# Patient Record
Sex: Female | Born: 1950
Health system: Southern US, Community
[De-identification: ages and names within clinical notes are randomized; demographics above are authoritative.]

## PROBLEM LIST (undated history)

## (undated) HISTORY — PX: EYE SURGERY: SHX253

## (undated) HISTORY — PX: TUBAL LIGATION: SHX77

## (undated) HISTORY — PX: APPENDECTOMY: SHX54

## (undated) HISTORY — PX: THYROID SURGERY: SHX805

---

## 2001-06-07 ENCOUNTER — Emergency Department (HOSPITAL_COMMUNITY): Admission: EM | Admit: 2001-06-07 | Discharge: 2001-06-07 | Payer: Self-pay | Admitting: Emergency Medicine

## 2003-06-28 ENCOUNTER — Ambulatory Visit (HOSPITAL_COMMUNITY): Admission: RE | Admit: 2003-06-28 | Discharge: 2003-06-28 | Payer: Self-pay | Admitting: Obstetrics and Gynecology

## 2012-05-18 ENCOUNTER — Other Ambulatory Visit: Payer: Self-pay | Admitting: Obstetrics and Gynecology

## 2012-05-18 ENCOUNTER — Other Ambulatory Visit (HOSPITAL_COMMUNITY)
Admission: RE | Admit: 2012-05-18 | Discharge: 2012-05-18 | Disposition: A | Discharge status: Home / Self Care | Payer: BC Managed Care – PPO | Source: Ambulatory Visit | Attending: Obstetrics and Gynecology | Admitting: Obstetrics and Gynecology

## 2012-05-18 DIAGNOSIS — Z01419 Encounter for gynecological examination (general) (routine) without abnormal findings: Secondary | ICD-10-CM | POA: Insufficient documentation

## 2012-05-18 DIAGNOSIS — Z1151 Encounter for screening for human papillomavirus (HPV): Secondary | ICD-10-CM | POA: Insufficient documentation

## 2012-08-12 ENCOUNTER — Other Ambulatory Visit: Payer: Self-pay | Admitting: Dermatology

## 2014-10-02 ENCOUNTER — Other Ambulatory Visit: Payer: Self-pay | Admitting: Dermatology

## 2016-10-07 DIAGNOSIS — L814 Other melanin hyperpigmentation: Secondary | ICD-10-CM | POA: Diagnosis not present

## 2016-10-07 DIAGNOSIS — D225 Melanocytic nevi of trunk: Secondary | ICD-10-CM | POA: Diagnosis not present

## 2016-10-07 DIAGNOSIS — C44612 Basal cell carcinoma of skin of right upper limb, including shoulder: Secondary | ICD-10-CM | POA: Diagnosis not present

## 2016-10-07 DIAGNOSIS — C44712 Basal cell carcinoma of skin of right lower limb, including hip: Secondary | ICD-10-CM | POA: Diagnosis not present

## 2016-10-07 DIAGNOSIS — D485 Neoplasm of uncertain behavior of skin: Secondary | ICD-10-CM | POA: Diagnosis not present

## 2016-10-07 DIAGNOSIS — D2261 Melanocytic nevi of right upper limb, including shoulder: Secondary | ICD-10-CM | POA: Diagnosis not present

## 2016-10-07 DIAGNOSIS — Z85828 Personal history of other malignant neoplasm of skin: Secondary | ICD-10-CM | POA: Diagnosis not present

## 2016-10-07 DIAGNOSIS — C44619 Basal cell carcinoma of skin of left upper limb, including shoulder: Secondary | ICD-10-CM | POA: Diagnosis not present

## 2016-10-07 DIAGNOSIS — L570 Actinic keratosis: Secondary | ICD-10-CM | POA: Diagnosis not present

## 2016-10-07 DIAGNOSIS — L821 Other seborrheic keratosis: Secondary | ICD-10-CM | POA: Diagnosis not present

## 2016-10-19 DIAGNOSIS — Z0000 Encounter for general adult medical examination without abnormal findings: Secondary | ICD-10-CM | POA: Diagnosis not present

## 2016-10-19 DIAGNOSIS — E785 Hyperlipidemia, unspecified: Secondary | ICD-10-CM | POA: Diagnosis not present

## 2016-10-19 DIAGNOSIS — E039 Hypothyroidism, unspecified: Secondary | ICD-10-CM | POA: Diagnosis not present

## 2016-10-19 DIAGNOSIS — E119 Type 2 diabetes mellitus without complications: Secondary | ICD-10-CM | POA: Diagnosis not present

## 2016-11-09 DIAGNOSIS — Z85828 Personal history of other malignant neoplasm of skin: Secondary | ICD-10-CM | POA: Diagnosis not present

## 2016-11-09 DIAGNOSIS — C44712 Basal cell carcinoma of skin of right lower limb, including hip: Secondary | ICD-10-CM | POA: Diagnosis not present

## 2016-11-09 DIAGNOSIS — C44612 Basal cell carcinoma of skin of right upper limb, including shoulder: Secondary | ICD-10-CM | POA: Diagnosis not present

## 2016-11-09 DIAGNOSIS — C44619 Basal cell carcinoma of skin of left upper limb, including shoulder: Secondary | ICD-10-CM | POA: Diagnosis not present

## 2016-11-09 DIAGNOSIS — L570 Actinic keratosis: Secondary | ICD-10-CM | POA: Diagnosis not present

## 2016-11-18 DIAGNOSIS — L0889 Other specified local infections of the skin and subcutaneous tissue: Secondary | ICD-10-CM | POA: Diagnosis not present

## 2016-12-21 DIAGNOSIS — E039 Hypothyroidism, unspecified: Secondary | ICD-10-CM | POA: Diagnosis not present

## 2016-12-21 DIAGNOSIS — E785 Hyperlipidemia, unspecified: Secondary | ICD-10-CM | POA: Diagnosis not present

## 2017-01-15 DIAGNOSIS — J0100 Acute maxillary sinusitis, unspecified: Secondary | ICD-10-CM | POA: Diagnosis not present

## 2017-01-25 ENCOUNTER — Other Ambulatory Visit (HOSPITAL_COMMUNITY)
Admission: RE | Admit: 2017-01-25 | Discharge: 2017-01-25 | Disposition: A | Discharge status: Home / Self Care | Payer: Medicare HMO | Source: Ambulatory Visit | Attending: Obstetrics and Gynecology | Admitting: Obstetrics and Gynecology

## 2017-01-25 ENCOUNTER — Other Ambulatory Visit: Payer: Self-pay | Admitting: Obstetrics and Gynecology

## 2017-01-25 DIAGNOSIS — N951 Menopausal and female climacteric states: Secondary | ICD-10-CM | POA: Diagnosis not present

## 2017-01-25 DIAGNOSIS — Z01419 Encounter for gynecological examination (general) (routine) without abnormal findings: Secondary | ICD-10-CM | POA: Diagnosis not present

## 2017-01-25 DIAGNOSIS — Z124 Encounter for screening for malignant neoplasm of cervix: Secondary | ICD-10-CM | POA: Diagnosis present

## 2017-01-27 LAB — CYTOLOGY - PAP
Diagnosis: NEGATIVE
HPV: NOT DETECTED

## 2017-03-15 DIAGNOSIS — Z1231 Encounter for screening mammogram for malignant neoplasm of breast: Secondary | ICD-10-CM | POA: Diagnosis not present

## 2017-04-12 DIAGNOSIS — Z780 Asymptomatic menopausal state: Secondary | ICD-10-CM | POA: Diagnosis not present

## 2017-04-12 DIAGNOSIS — D225 Melanocytic nevi of trunk: Secondary | ICD-10-CM | POA: Diagnosis not present

## 2017-04-12 DIAGNOSIS — L821 Other seborrheic keratosis: Secondary | ICD-10-CM | POA: Diagnosis not present

## 2017-04-12 DIAGNOSIS — L814 Other melanin hyperpigmentation: Secondary | ICD-10-CM | POA: Diagnosis not present

## 2017-04-12 DIAGNOSIS — D2262 Melanocytic nevi of left upper limb, including shoulder: Secondary | ICD-10-CM | POA: Diagnosis not present

## 2017-04-12 DIAGNOSIS — D1801 Hemangioma of skin and subcutaneous tissue: Secondary | ICD-10-CM | POA: Diagnosis not present

## 2017-04-12 DIAGNOSIS — D2239 Melanocytic nevi of other parts of face: Secondary | ICD-10-CM | POA: Diagnosis not present

## 2017-04-12 DIAGNOSIS — Z85828 Personal history of other malignant neoplasm of skin: Secondary | ICD-10-CM | POA: Diagnosis not present

## 2017-04-12 DIAGNOSIS — D2371 Other benign neoplasm of skin of right lower limb, including hip: Secondary | ICD-10-CM | POA: Diagnosis not present

## 2017-04-12 DIAGNOSIS — C44619 Basal cell carcinoma of skin of left upper limb, including shoulder: Secondary | ICD-10-CM | POA: Diagnosis not present

## 2017-04-12 DIAGNOSIS — L570 Actinic keratosis: Secondary | ICD-10-CM | POA: Diagnosis not present

## 2017-04-12 DIAGNOSIS — C44519 Basal cell carcinoma of skin of other part of trunk: Secondary | ICD-10-CM | POA: Diagnosis not present

## 2017-04-12 DIAGNOSIS — M810 Age-related osteoporosis without current pathological fracture: Secondary | ICD-10-CM | POA: Diagnosis not present

## 2017-04-15 DIAGNOSIS — C44519 Basal cell carcinoma of skin of other part of trunk: Secondary | ICD-10-CM | POA: Diagnosis not present

## 2017-04-15 DIAGNOSIS — Z85828 Personal history of other malignant neoplasm of skin: Secondary | ICD-10-CM | POA: Diagnosis not present

## 2017-05-10 DIAGNOSIS — M810 Age-related osteoporosis without current pathological fracture: Secondary | ICD-10-CM | POA: Diagnosis not present

## 2017-05-10 DIAGNOSIS — E890 Postprocedural hypothyroidism: Secondary | ICD-10-CM | POA: Diagnosis not present

## 2017-06-16 DIAGNOSIS — R69 Illness, unspecified: Secondary | ICD-10-CM | POA: Diagnosis not present

## 2017-06-21 DIAGNOSIS — H52203 Unspecified astigmatism, bilateral: Secondary | ICD-10-CM | POA: Diagnosis not present

## 2017-06-21 DIAGNOSIS — H5203 Hypermetropia, bilateral: Secondary | ICD-10-CM | POA: Diagnosis not present

## 2017-06-21 DIAGNOSIS — H524 Presbyopia: Secondary | ICD-10-CM | POA: Diagnosis not present

## 2017-07-14 DIAGNOSIS — E039 Hypothyroidism, unspecified: Secondary | ICD-10-CM | POA: Diagnosis not present

## 2017-08-18 DIAGNOSIS — D1801 Hemangioma of skin and subcutaneous tissue: Secondary | ICD-10-CM | POA: Diagnosis not present

## 2017-08-18 DIAGNOSIS — L814 Other melanin hyperpigmentation: Secondary | ICD-10-CM | POA: Diagnosis not present

## 2017-08-18 DIAGNOSIS — Z85828 Personal history of other malignant neoplasm of skin: Secondary | ICD-10-CM | POA: Diagnosis not present

## 2017-08-18 DIAGNOSIS — L438 Other lichen planus: Secondary | ICD-10-CM | POA: Diagnosis not present

## 2017-09-30 DIAGNOSIS — E7800 Pure hypercholesterolemia, unspecified: Secondary | ICD-10-CM | POA: Diagnosis not present

## 2017-09-30 DIAGNOSIS — E039 Hypothyroidism, unspecified: Secondary | ICD-10-CM | POA: Diagnosis not present

## 2017-09-30 DIAGNOSIS — E119 Type 2 diabetes mellitus without complications: Secondary | ICD-10-CM | POA: Diagnosis not present

## 2017-10-01 DIAGNOSIS — E039 Hypothyroidism, unspecified: Secondary | ICD-10-CM | POA: Diagnosis not present

## 2017-10-20 DIAGNOSIS — C73 Malignant neoplasm of thyroid gland: Secondary | ICD-10-CM | POA: Diagnosis not present

## 2017-10-20 DIAGNOSIS — E890 Postprocedural hypothyroidism: Secondary | ICD-10-CM | POA: Diagnosis not present

## 2017-11-17 DIAGNOSIS — E890 Postprocedural hypothyroidism: Secondary | ICD-10-CM | POA: Diagnosis not present

## 2017-11-19 DIAGNOSIS — D2272 Melanocytic nevi of left lower limb, including hip: Secondary | ICD-10-CM | POA: Diagnosis not present

## 2017-11-19 DIAGNOSIS — Z85828 Personal history of other malignant neoplasm of skin: Secondary | ICD-10-CM | POA: Diagnosis not present

## 2017-11-19 DIAGNOSIS — D2271 Melanocytic nevi of right lower limb, including hip: Secondary | ICD-10-CM | POA: Diagnosis not present

## 2017-11-19 DIAGNOSIS — D2261 Melanocytic nevi of right upper limb, including shoulder: Secondary | ICD-10-CM | POA: Diagnosis not present

## 2017-11-19 DIAGNOSIS — C44519 Basal cell carcinoma of skin of other part of trunk: Secondary | ICD-10-CM | POA: Diagnosis not present

## 2017-11-19 DIAGNOSIS — D2262 Melanocytic nevi of left upper limb, including shoulder: Secondary | ICD-10-CM | POA: Diagnosis not present

## 2017-11-19 DIAGNOSIS — L821 Other seborrheic keratosis: Secondary | ICD-10-CM | POA: Diagnosis not present

## 2017-11-19 DIAGNOSIS — D225 Melanocytic nevi of trunk: Secondary | ICD-10-CM | POA: Diagnosis not present

## 2017-11-19 DIAGNOSIS — L814 Other melanin hyperpigmentation: Secondary | ICD-10-CM | POA: Diagnosis not present

## 2017-11-19 DIAGNOSIS — D1801 Hemangioma of skin and subcutaneous tissue: Secondary | ICD-10-CM | POA: Diagnosis not present

## 2017-12-10 DIAGNOSIS — K644 Residual hemorrhoidal skin tags: Secondary | ICD-10-CM | POA: Diagnosis not present

## 2017-12-22 DIAGNOSIS — R69 Illness, unspecified: Secondary | ICD-10-CM | POA: Diagnosis not present

## 2018-02-21 DIAGNOSIS — Z01411 Encounter for gynecological examination (general) (routine) with abnormal findings: Secondary | ICD-10-CM | POA: Diagnosis not present

## 2018-02-21 DIAGNOSIS — M810 Age-related osteoporosis without current pathological fracture: Secondary | ICD-10-CM | POA: Diagnosis not present

## 2018-02-21 DIAGNOSIS — E890 Postprocedural hypothyroidism: Secondary | ICD-10-CM | POA: Diagnosis not present

## 2018-03-23 DIAGNOSIS — Z1231 Encounter for screening mammogram for malignant neoplasm of breast: Secondary | ICD-10-CM | POA: Diagnosis not present

## 2018-04-01 DIAGNOSIS — L820 Inflamed seborrheic keratosis: Secondary | ICD-10-CM | POA: Diagnosis not present

## 2018-04-01 DIAGNOSIS — Z85828 Personal history of other malignant neoplasm of skin: Secondary | ICD-10-CM | POA: Diagnosis not present

## 2018-05-09 DIAGNOSIS — K921 Melena: Secondary | ICD-10-CM | POA: Diagnosis not present

## 2018-05-09 DIAGNOSIS — K5900 Constipation, unspecified: Secondary | ICD-10-CM | POA: Diagnosis not present

## 2018-05-18 DIAGNOSIS — K642 Third degree hemorrhoids: Secondary | ICD-10-CM | POA: Diagnosis not present

## 2018-05-18 DIAGNOSIS — K921 Melena: Secondary | ICD-10-CM | POA: Diagnosis not present

## 2018-05-18 DIAGNOSIS — K635 Polyp of colon: Secondary | ICD-10-CM | POA: Diagnosis not present

## 2018-05-18 DIAGNOSIS — D126 Benign neoplasm of colon, unspecified: Secondary | ICD-10-CM | POA: Diagnosis not present

## 2018-05-20 DIAGNOSIS — L570 Actinic keratosis: Secondary | ICD-10-CM | POA: Diagnosis not present

## 2018-05-20 DIAGNOSIS — L821 Other seborrheic keratosis: Secondary | ICD-10-CM | POA: Diagnosis not present

## 2018-05-20 DIAGNOSIS — L820 Inflamed seborrheic keratosis: Secondary | ICD-10-CM | POA: Diagnosis not present

## 2018-05-20 DIAGNOSIS — D225 Melanocytic nevi of trunk: Secondary | ICD-10-CM | POA: Diagnosis not present

## 2018-05-20 DIAGNOSIS — D1801 Hemangioma of skin and subcutaneous tissue: Secondary | ICD-10-CM | POA: Diagnosis not present

## 2018-05-20 DIAGNOSIS — D126 Benign neoplasm of colon, unspecified: Secondary | ICD-10-CM | POA: Diagnosis not present

## 2018-05-20 DIAGNOSIS — D2239 Melanocytic nevi of other parts of face: Secondary | ICD-10-CM | POA: Diagnosis not present

## 2018-05-20 DIAGNOSIS — C44519 Basal cell carcinoma of skin of other part of trunk: Secondary | ICD-10-CM | POA: Diagnosis not present

## 2018-05-20 DIAGNOSIS — K635 Polyp of colon: Secondary | ICD-10-CM | POA: Diagnosis not present

## 2018-05-20 DIAGNOSIS — Z85828 Personal history of other malignant neoplasm of skin: Secondary | ICD-10-CM | POA: Diagnosis not present

## 2018-05-20 DIAGNOSIS — D2262 Melanocytic nevi of left upper limb, including shoulder: Secondary | ICD-10-CM | POA: Diagnosis not present

## 2018-05-20 DIAGNOSIS — D2261 Melanocytic nevi of right upper limb, including shoulder: Secondary | ICD-10-CM | POA: Diagnosis not present

## 2018-06-27 DIAGNOSIS — H5203 Hypermetropia, bilateral: Secondary | ICD-10-CM | POA: Diagnosis not present

## 2018-06-27 DIAGNOSIS — H524 Presbyopia: Secondary | ICD-10-CM | POA: Diagnosis not present

## 2018-06-27 DIAGNOSIS — E119 Type 2 diabetes mellitus without complications: Secondary | ICD-10-CM | POA: Diagnosis not present

## 2018-06-27 DIAGNOSIS — H52203 Unspecified astigmatism, bilateral: Secondary | ICD-10-CM | POA: Diagnosis not present

## 2018-06-27 DIAGNOSIS — H2513 Age-related nuclear cataract, bilateral: Secondary | ICD-10-CM | POA: Diagnosis not present

## 2018-06-30 DIAGNOSIS — E559 Vitamin D deficiency, unspecified: Secondary | ICD-10-CM | POA: Diagnosis not present

## 2018-06-30 DIAGNOSIS — E039 Hypothyroidism, unspecified: Secondary | ICD-10-CM | POA: Diagnosis not present

## 2018-06-30 DIAGNOSIS — E7800 Pure hypercholesterolemia, unspecified: Secondary | ICD-10-CM | POA: Diagnosis not present

## 2018-06-30 DIAGNOSIS — E119 Type 2 diabetes mellitus without complications: Secondary | ICD-10-CM | POA: Diagnosis not present

## 2018-06-30 DIAGNOSIS — R69 Illness, unspecified: Secondary | ICD-10-CM | POA: Diagnosis not present

## 2018-10-05 DIAGNOSIS — E785 Hyperlipidemia, unspecified: Secondary | ICD-10-CM | POA: Diagnosis not present

## 2018-10-14 DIAGNOSIS — H16292 Other keratoconjunctivitis, left eye: Secondary | ICD-10-CM | POA: Diagnosis not present

## 2018-11-30 DIAGNOSIS — D1801 Hemangioma of skin and subcutaneous tissue: Secondary | ICD-10-CM | POA: Diagnosis not present

## 2018-11-30 DIAGNOSIS — D225 Melanocytic nevi of trunk: Secondary | ICD-10-CM | POA: Diagnosis not present

## 2018-11-30 DIAGNOSIS — L570 Actinic keratosis: Secondary | ICD-10-CM | POA: Diagnosis not present

## 2018-11-30 DIAGNOSIS — D2262 Melanocytic nevi of left upper limb, including shoulder: Secondary | ICD-10-CM | POA: Diagnosis not present

## 2018-11-30 DIAGNOSIS — L821 Other seborrheic keratosis: Secondary | ICD-10-CM | POA: Diagnosis not present

## 2018-11-30 DIAGNOSIS — Z85828 Personal history of other malignant neoplasm of skin: Secondary | ICD-10-CM | POA: Diagnosis not present

## 2018-11-30 DIAGNOSIS — L814 Other melanin hyperpigmentation: Secondary | ICD-10-CM | POA: Diagnosis not present

## 2018-11-30 DIAGNOSIS — D2261 Melanocytic nevi of right upper limb, including shoulder: Secondary | ICD-10-CM | POA: Diagnosis not present

## 2018-11-30 DIAGNOSIS — C44519 Basal cell carcinoma of skin of other part of trunk: Secondary | ICD-10-CM | POA: Diagnosis not present

## 2019-03-08 DIAGNOSIS — E119 Type 2 diabetes mellitus without complications: Secondary | ICD-10-CM | POA: Diagnosis not present

## 2019-03-08 DIAGNOSIS — E559 Vitamin D deficiency, unspecified: Secondary | ICD-10-CM | POA: Diagnosis not present

## 2019-03-08 DIAGNOSIS — E7800 Pure hypercholesterolemia, unspecified: Secondary | ICD-10-CM | POA: Diagnosis not present

## 2019-03-08 DIAGNOSIS — E890 Postprocedural hypothyroidism: Secondary | ICD-10-CM | POA: Diagnosis not present

## 2019-03-21 DIAGNOSIS — R69 Illness, unspecified: Secondary | ICD-10-CM | POA: Diagnosis not present

## 2019-03-22 DIAGNOSIS — E119 Type 2 diabetes mellitus without complications: Secondary | ICD-10-CM | POA: Diagnosis not present

## 2019-03-22 DIAGNOSIS — E890 Postprocedural hypothyroidism: Secondary | ICD-10-CM | POA: Diagnosis not present

## 2019-03-29 DIAGNOSIS — Z1231 Encounter for screening mammogram for malignant neoplasm of breast: Secondary | ICD-10-CM | POA: Diagnosis not present

## 2019-03-31 DIAGNOSIS — M810 Age-related osteoporosis without current pathological fracture: Secondary | ICD-10-CM | POA: Diagnosis not present

## 2019-03-31 DIAGNOSIS — Z85850 Personal history of malignant neoplasm of thyroid: Secondary | ICD-10-CM | POA: Diagnosis not present

## 2019-03-31 DIAGNOSIS — Z8262 Family history of osteoporosis: Secondary | ICD-10-CM | POA: Diagnosis not present

## 2019-03-31 DIAGNOSIS — Z780 Asymptomatic menopausal state: Secondary | ICD-10-CM | POA: Diagnosis not present

## 2019-04-13 DIAGNOSIS — Z01419 Encounter for gynecological examination (general) (routine) without abnormal findings: Secondary | ICD-10-CM | POA: Diagnosis not present

## 2019-05-16 DIAGNOSIS — H11002 Unspecified pterygium of left eye: Secondary | ICD-10-CM | POA: Diagnosis not present

## 2019-05-16 DIAGNOSIS — R030 Elevated blood-pressure reading, without diagnosis of hypertension: Secondary | ICD-10-CM | POA: Diagnosis not present

## 2019-05-16 DIAGNOSIS — H1132 Conjunctival hemorrhage, left eye: Secondary | ICD-10-CM | POA: Diagnosis not present

## 2019-06-02 DIAGNOSIS — Z85828 Personal history of other malignant neoplasm of skin: Secondary | ICD-10-CM | POA: Diagnosis not present

## 2019-06-02 DIAGNOSIS — D225 Melanocytic nevi of trunk: Secondary | ICD-10-CM | POA: Diagnosis not present

## 2019-06-02 DIAGNOSIS — D2272 Melanocytic nevi of left lower limb, including hip: Secondary | ICD-10-CM | POA: Diagnosis not present

## 2019-06-02 DIAGNOSIS — L821 Other seborrheic keratosis: Secondary | ICD-10-CM | POA: Diagnosis not present

## 2019-06-20 DIAGNOSIS — E890 Postprocedural hypothyroidism: Secondary | ICD-10-CM | POA: Diagnosis not present

## 2019-06-20 DIAGNOSIS — R69 Illness, unspecified: Secondary | ICD-10-CM | POA: Diagnosis not present

## 2019-06-29 DIAGNOSIS — H52203 Unspecified astigmatism, bilateral: Secondary | ICD-10-CM | POA: Diagnosis not present

## 2019-06-29 DIAGNOSIS — H5203 Hypermetropia, bilateral: Secondary | ICD-10-CM | POA: Diagnosis not present

## 2019-06-29 DIAGNOSIS — H524 Presbyopia: Secondary | ICD-10-CM | POA: Diagnosis not present

## 2019-09-12 DIAGNOSIS — R69 Illness, unspecified: Secondary | ICD-10-CM | POA: Diagnosis not present

## 2019-09-12 DIAGNOSIS — E890 Postprocedural hypothyroidism: Secondary | ICD-10-CM | POA: Diagnosis not present

## 2019-09-12 DIAGNOSIS — E119 Type 2 diabetes mellitus without complications: Secondary | ICD-10-CM | POA: Diagnosis not present

## 2019-09-12 DIAGNOSIS — E7800 Pure hypercholesterolemia, unspecified: Secondary | ICD-10-CM | POA: Diagnosis not present

## 2019-11-20 DIAGNOSIS — Z7189 Other specified counseling: Secondary | ICD-10-CM | POA: Diagnosis not present

## 2019-11-20 DIAGNOSIS — R1310 Dysphagia, unspecified: Secondary | ICD-10-CM | POA: Diagnosis not present

## 2019-11-20 DIAGNOSIS — R519 Headache, unspecified: Secondary | ICD-10-CM | POA: Diagnosis not present

## 2019-11-20 DIAGNOSIS — R197 Diarrhea, unspecified: Secondary | ICD-10-CM | POA: Diagnosis not present

## 2019-12-05 DIAGNOSIS — Z85828 Personal history of other malignant neoplasm of skin: Secondary | ICD-10-CM | POA: Diagnosis not present

## 2019-12-05 DIAGNOSIS — D2262 Melanocytic nevi of left upper limb, including shoulder: Secondary | ICD-10-CM | POA: Diagnosis not present

## 2019-12-05 DIAGNOSIS — L438 Other lichen planus: Secondary | ICD-10-CM | POA: Diagnosis not present

## 2019-12-05 DIAGNOSIS — L821 Other seborrheic keratosis: Secondary | ICD-10-CM | POA: Diagnosis not present

## 2019-12-05 DIAGNOSIS — D2261 Melanocytic nevi of right upper limb, including shoulder: Secondary | ICD-10-CM | POA: Diagnosis not present

## 2019-12-05 DIAGNOSIS — D2272 Melanocytic nevi of left lower limb, including hip: Secondary | ICD-10-CM | POA: Diagnosis not present

## 2019-12-05 DIAGNOSIS — L814 Other melanin hyperpigmentation: Secondary | ICD-10-CM | POA: Diagnosis not present

## 2019-12-05 DIAGNOSIS — D225 Melanocytic nevi of trunk: Secondary | ICD-10-CM | POA: Diagnosis not present

## 2019-12-05 DIAGNOSIS — D2239 Melanocytic nevi of other parts of face: Secondary | ICD-10-CM | POA: Diagnosis not present

## 2019-12-05 DIAGNOSIS — D2271 Melanocytic nevi of right lower limb, including hip: Secondary | ICD-10-CM | POA: Diagnosis not present

## 2019-12-26 DIAGNOSIS — R69 Illness, unspecified: Secondary | ICD-10-CM | POA: Diagnosis not present

## 2019-12-27 DIAGNOSIS — Z0100 Encounter for examination of eyes and vision without abnormal findings: Secondary | ICD-10-CM | POA: Diagnosis not present

## 2019-12-27 DIAGNOSIS — H524 Presbyopia: Secondary | ICD-10-CM | POA: Diagnosis not present

## 2020-01-05 DIAGNOSIS — R0989 Other specified symptoms and signs involving the circulatory and respiratory systems: Secondary | ICD-10-CM | POA: Diagnosis not present

## 2020-01-05 DIAGNOSIS — E890 Postprocedural hypothyroidism: Secondary | ICD-10-CM | POA: Diagnosis not present

## 2020-01-08 ENCOUNTER — Ambulatory Visit
Admission: RE | Admit: 2020-01-08 | Discharge: 2020-01-08 | Disposition: A | Discharge status: Home / Self Care | Payer: Medicare HMO | Source: Ambulatory Visit | Attending: Family Medicine | Admitting: Family Medicine

## 2020-01-08 ENCOUNTER — Other Ambulatory Visit: Payer: Self-pay | Admitting: Family Medicine

## 2020-01-08 DIAGNOSIS — R0989 Other specified symptoms and signs involving the circulatory and respiratory systems: Secondary | ICD-10-CM

## 2020-01-08 DIAGNOSIS — J90 Pleural effusion, not elsewhere classified: Secondary | ICD-10-CM | POA: Diagnosis not present

## 2020-01-08 DIAGNOSIS — J984 Other disorders of lung: Secondary | ICD-10-CM | POA: Diagnosis not present

## 2020-03-11 DIAGNOSIS — Z20822 Contact with and (suspected) exposure to covid-19: Secondary | ICD-10-CM | POA: Diagnosis not present

## 2020-04-04 DIAGNOSIS — Z0000 Encounter for general adult medical examination without abnormal findings: Secondary | ICD-10-CM | POA: Diagnosis not present

## 2020-04-04 DIAGNOSIS — E7800 Pure hypercholesterolemia, unspecified: Secondary | ICD-10-CM | POA: Diagnosis not present

## 2020-04-04 DIAGNOSIS — E890 Postprocedural hypothyroidism: Secondary | ICD-10-CM | POA: Diagnosis not present

## 2020-04-04 DIAGNOSIS — E119 Type 2 diabetes mellitus without complications: Secondary | ICD-10-CM | POA: Diagnosis not present

## 2020-04-24 DIAGNOSIS — Z1231 Encounter for screening mammogram for malignant neoplasm of breast: Secondary | ICD-10-CM | POA: Diagnosis not present

## 2020-04-29 DIAGNOSIS — R928 Other abnormal and inconclusive findings on diagnostic imaging of breast: Secondary | ICD-10-CM | POA: Diagnosis not present

## 2020-04-29 DIAGNOSIS — R922 Inconclusive mammogram: Secondary | ICD-10-CM | POA: Diagnosis not present

## 2020-06-10 DIAGNOSIS — R69 Illness, unspecified: Secondary | ICD-10-CM | POA: Diagnosis not present

## 2020-06-17 DIAGNOSIS — D485 Neoplasm of uncertain behavior of skin: Secondary | ICD-10-CM | POA: Diagnosis not present

## 2020-06-17 DIAGNOSIS — D2261 Melanocytic nevi of right upper limb, including shoulder: Secondary | ICD-10-CM | POA: Diagnosis not present

## 2020-06-17 DIAGNOSIS — D225 Melanocytic nevi of trunk: Secondary | ICD-10-CM | POA: Diagnosis not present

## 2020-06-17 DIAGNOSIS — L570 Actinic keratosis: Secondary | ICD-10-CM | POA: Diagnosis not present

## 2020-06-17 DIAGNOSIS — D045 Carcinoma in situ of skin of trunk: Secondary | ICD-10-CM | POA: Diagnosis not present

## 2020-06-17 DIAGNOSIS — L821 Other seborrheic keratosis: Secondary | ICD-10-CM | POA: Diagnosis not present

## 2020-06-17 DIAGNOSIS — Z85828 Personal history of other malignant neoplasm of skin: Secondary | ICD-10-CM | POA: Diagnosis not present

## 2020-06-17 DIAGNOSIS — D2262 Melanocytic nevi of left upper limb, including shoulder: Secondary | ICD-10-CM | POA: Diagnosis not present

## 2020-07-01 DIAGNOSIS — H524 Presbyopia: Secondary | ICD-10-CM | POA: Diagnosis not present

## 2020-07-01 DIAGNOSIS — H2513 Age-related nuclear cataract, bilateral: Secondary | ICD-10-CM | POA: Diagnosis not present

## 2020-07-01 DIAGNOSIS — E1136 Type 2 diabetes mellitus with diabetic cataract: Secondary | ICD-10-CM | POA: Diagnosis not present

## 2020-07-01 DIAGNOSIS — H52203 Unspecified astigmatism, bilateral: Secondary | ICD-10-CM | POA: Diagnosis not present

## 2020-07-01 DIAGNOSIS — H5203 Hypermetropia, bilateral: Secondary | ICD-10-CM | POA: Diagnosis not present

## 2020-07-02 DIAGNOSIS — L988 Other specified disorders of the skin and subcutaneous tissue: Secondary | ICD-10-CM | POA: Diagnosis not present

## 2020-07-02 DIAGNOSIS — D485 Neoplasm of uncertain behavior of skin: Secondary | ICD-10-CM | POA: Diagnosis not present

## 2020-07-02 DIAGNOSIS — R69 Illness, unspecified: Secondary | ICD-10-CM | POA: Diagnosis not present

## 2020-07-02 DIAGNOSIS — Z85828 Personal history of other malignant neoplasm of skin: Secondary | ICD-10-CM | POA: Diagnosis not present

## 2020-07-02 DIAGNOSIS — D045 Carcinoma in situ of skin of trunk: Secondary | ICD-10-CM | POA: Diagnosis not present

## 2020-07-11 DIAGNOSIS — Z4802 Encounter for removal of sutures: Secondary | ICD-10-CM | POA: Diagnosis not present

## 2020-08-19 DIAGNOSIS — L988 Other specified disorders of the skin and subcutaneous tissue: Secondary | ICD-10-CM | POA: Diagnosis not present

## 2020-08-19 DIAGNOSIS — C73 Malignant neoplasm of thyroid gland: Secondary | ICD-10-CM | POA: Diagnosis not present

## 2020-08-19 DIAGNOSIS — E890 Postprocedural hypothyroidism: Secondary | ICD-10-CM | POA: Diagnosis not present

## 2020-08-19 DIAGNOSIS — C4359 Malignant melanoma of other part of trunk: Secondary | ICD-10-CM | POA: Diagnosis not present

## 2020-08-19 DIAGNOSIS — D485 Neoplasm of uncertain behavior of skin: Secondary | ICD-10-CM | POA: Diagnosis not present

## 2020-08-19 DIAGNOSIS — L821 Other seborrheic keratosis: Secondary | ICD-10-CM | POA: Diagnosis not present

## 2020-08-20 ENCOUNTER — Ambulatory Visit
Admission: RE | Admit: 2020-08-20 | Discharge: 2020-08-20 | Disposition: A | Discharge status: Home / Self Care | Payer: Managed Care, Other (non HMO) | Source: Ambulatory Visit | Attending: Family Medicine | Admitting: Family Medicine

## 2020-08-20 ENCOUNTER — Other Ambulatory Visit: Payer: Self-pay | Admitting: Family Medicine

## 2020-08-20 ENCOUNTER — Other Ambulatory Visit: Payer: Self-pay

## 2020-08-20 DIAGNOSIS — R52 Pain, unspecified: Secondary | ICD-10-CM

## 2020-08-20 DIAGNOSIS — M79672 Pain in left foot: Secondary | ICD-10-CM | POA: Diagnosis not present

## 2020-09-11 DIAGNOSIS — L821 Other seborrheic keratosis: Secondary | ICD-10-CM | POA: Diagnosis not present

## 2020-09-11 DIAGNOSIS — Z85820 Personal history of malignant melanoma of skin: Secondary | ICD-10-CM | POA: Diagnosis not present

## 2020-09-11 DIAGNOSIS — D485 Neoplasm of uncertain behavior of skin: Secondary | ICD-10-CM | POA: Diagnosis not present

## 2020-09-11 DIAGNOSIS — C4359 Malignant melanoma of other part of trunk: Secondary | ICD-10-CM | POA: Diagnosis not present

## 2020-09-25 DIAGNOSIS — M79672 Pain in left foot: Secondary | ICD-10-CM | POA: Diagnosis not present

## 2020-09-27 DIAGNOSIS — E7800 Pure hypercholesterolemia, unspecified: Secondary | ICD-10-CM | POA: Diagnosis not present

## 2020-09-27 DIAGNOSIS — E890 Postprocedural hypothyroidism: Secondary | ICD-10-CM | POA: Diagnosis not present

## 2020-09-27 DIAGNOSIS — E119 Type 2 diabetes mellitus without complications: Secondary | ICD-10-CM | POA: Diagnosis not present

## 2020-10-11 DIAGNOSIS — K2100 Gastro-esophageal reflux disease with esophagitis, without bleeding: Secondary | ICD-10-CM | POA: Diagnosis not present

## 2020-11-19 DIAGNOSIS — E890 Postprocedural hypothyroidism: Secondary | ICD-10-CM | POA: Diagnosis not present

## 2020-12-17 DIAGNOSIS — D225 Melanocytic nevi of trunk: Secondary | ICD-10-CM | POA: Diagnosis not present

## 2020-12-17 DIAGNOSIS — L570 Actinic keratosis: Secondary | ICD-10-CM | POA: Diagnosis not present

## 2020-12-17 DIAGNOSIS — L814 Other melanin hyperpigmentation: Secondary | ICD-10-CM | POA: Diagnosis not present

## 2020-12-17 DIAGNOSIS — D0359 Melanoma in situ of other part of trunk: Secondary | ICD-10-CM | POA: Diagnosis not present

## 2020-12-17 DIAGNOSIS — D2272 Melanocytic nevi of left lower limb, including hip: Secondary | ICD-10-CM | POA: Diagnosis not present

## 2020-12-17 DIAGNOSIS — D1801 Hemangioma of skin and subcutaneous tissue: Secondary | ICD-10-CM | POA: Diagnosis not present

## 2020-12-17 DIAGNOSIS — D045 Carcinoma in situ of skin of trunk: Secondary | ICD-10-CM | POA: Diagnosis not present

## 2020-12-17 DIAGNOSIS — L821 Other seborrheic keratosis: Secondary | ICD-10-CM | POA: Diagnosis not present

## 2020-12-17 DIAGNOSIS — Z85828 Personal history of other malignant neoplasm of skin: Secondary | ICD-10-CM | POA: Diagnosis not present

## 2020-12-17 DIAGNOSIS — Z85820 Personal history of malignant melanoma of skin: Secondary | ICD-10-CM | POA: Diagnosis not present

## 2020-12-31 DIAGNOSIS — D0359 Melanoma in situ of other part of trunk: Secondary | ICD-10-CM | POA: Diagnosis not present

## 2020-12-31 DIAGNOSIS — L988 Other specified disorders of the skin and subcutaneous tissue: Secondary | ICD-10-CM | POA: Diagnosis not present

## 2020-12-31 DIAGNOSIS — Z85820 Personal history of malignant melanoma of skin: Secondary | ICD-10-CM | POA: Diagnosis not present

## 2021-02-20 DIAGNOSIS — L570 Actinic keratosis: Secondary | ICD-10-CM | POA: Diagnosis not present

## 2021-02-20 DIAGNOSIS — L821 Other seborrheic keratosis: Secondary | ICD-10-CM | POA: Diagnosis not present

## 2021-02-20 DIAGNOSIS — Z85820 Personal history of malignant melanoma of skin: Secondary | ICD-10-CM | POA: Diagnosis not present

## 2021-03-07 DIAGNOSIS — E119 Type 2 diabetes mellitus without complications: Secondary | ICD-10-CM | POA: Diagnosis not present

## 2021-03-07 DIAGNOSIS — R252 Cramp and spasm: Secondary | ICD-10-CM | POA: Diagnosis not present

## 2021-03-20 DIAGNOSIS — Z85820 Personal history of malignant melanoma of skin: Secondary | ICD-10-CM | POA: Diagnosis not present

## 2021-03-20 DIAGNOSIS — L814 Other melanin hyperpigmentation: Secondary | ICD-10-CM | POA: Diagnosis not present

## 2021-03-20 DIAGNOSIS — D2239 Melanocytic nevi of other parts of face: Secondary | ICD-10-CM | POA: Diagnosis not present

## 2021-03-20 DIAGNOSIS — Z85828 Personal history of other malignant neoplasm of skin: Secondary | ICD-10-CM | POA: Diagnosis not present

## 2021-05-01 DIAGNOSIS — Z1231 Encounter for screening mammogram for malignant neoplasm of breast: Secondary | ICD-10-CM | POA: Diagnosis not present

## 2021-05-12 ENCOUNTER — Ambulatory Visit (INDEPENDENT_AMBULATORY_CARE_PROVIDER_SITE_OTHER): Payer: Medicare HMO

## 2021-05-12 ENCOUNTER — Ambulatory Visit (INDEPENDENT_AMBULATORY_CARE_PROVIDER_SITE_OTHER): Payer: Medicare HMO | Admitting: Podiatry

## 2021-05-12 ENCOUNTER — Encounter: Payer: Self-pay | Admitting: Podiatry

## 2021-05-12 ENCOUNTER — Other Ambulatory Visit: Payer: Self-pay

## 2021-05-12 DIAGNOSIS — M7752 Other enthesopathy of left foot: Secondary | ICD-10-CM | POA: Diagnosis not present

## 2021-05-12 DIAGNOSIS — M779 Enthesopathy, unspecified: Secondary | ICD-10-CM

## 2021-05-12 MED ORDER — TRIAMCINOLONE ACETONIDE 10 MG/ML IJ SUSP
10.0000 mg | Freq: Once | INTRAMUSCULAR | Status: AC
  Administered 2021-05-12: 10 mg

## 2021-05-14 NOTE — Progress Notes (Signed)
Subjective:   Patient ID: [name] [name], female   DOB: 70 y.o.   MRN: [id number]   HPI Patient presents stating she has a lot of pain on the dorsum of her left foot and this is been present for around 10 months.  She saw another physician who did not do anything to be of help to her and it stiff and bruising and she has had to stop activities.  She does not smoke and does get occasional numbness in her toes   Review of Systems  All other systems reviewed and are negative.      Objective:  Physical Exam Vitals and nursing note reviewed.  Constitutional:      Appearance: She is well-developed.  Pulmonary:     Effort: Pulmonary effort is normal.  Musculoskeletal:        General: Normal range of motion.  Skin:    General: Skin is warm.  Neurological:     Mental Status: She is alert.    Neurovascular status intact muscle strength found to be adequate range of motion adequate.  Patient is found to have inflammation fluid and pain of the third metatarsal phalangeal joint left foot with slight separation between the second and third toes and currently no discomfort in the interspace with mild discomfort of the second MPJ also noted.  Patient is found to have good digital perfusion well oriented x3     Assessment:  Probability for inflammatory capsulitis of the MPJ with possibility for flexor plate disruption or stretch     Plan:  H&P all conditions reviewed.  Today I anesthetized 60 mg like Marcaine mixture sterile prep done and using sterile instrumentation I aspirated the third MPJ getting out a small amount of clear fluid and injected quarter cc dexamethasone Kenalog and applied thick plantar pad to disperse weight off the joint along with rigid bottom shoes.  Patient will be seen back to recheck again in the next few weeks and is encouraged to call questions concerns which may arise  X-rays indicate that there is no signs that there is a fracture or arthritis associated with this with  mild separation of the second and third toes with medial deviation of the second digit

## 2021-05-21 DIAGNOSIS — Z0000 Encounter for general adult medical examination without abnormal findings: Secondary | ICD-10-CM | POA: Diagnosis not present

## 2021-05-21 DIAGNOSIS — E7800 Pure hypercholesterolemia, unspecified: Secondary | ICD-10-CM | POA: Diagnosis not present

## 2021-05-21 DIAGNOSIS — E119 Type 2 diabetes mellitus without complications: Secondary | ICD-10-CM | POA: Diagnosis not present

## 2021-05-21 DIAGNOSIS — E890 Postprocedural hypothyroidism: Secondary | ICD-10-CM | POA: Diagnosis not present

## 2021-06-02 ENCOUNTER — Ambulatory Visit (INDEPENDENT_AMBULATORY_CARE_PROVIDER_SITE_OTHER): Payer: Medicare HMO

## 2021-06-02 ENCOUNTER — Other Ambulatory Visit: Payer: Self-pay

## 2021-06-02 ENCOUNTER — Ambulatory Visit: Payer: Medicare HMO | Admitting: Podiatry

## 2021-06-02 ENCOUNTER — Encounter: Payer: Self-pay | Admitting: Podiatry

## 2021-06-02 DIAGNOSIS — M79671 Pain in right foot: Secondary | ICD-10-CM

## 2021-06-02 DIAGNOSIS — M79672 Pain in left foot: Secondary | ICD-10-CM

## 2021-06-02 DIAGNOSIS — M7752 Other enthesopathy of left foot: Secondary | ICD-10-CM | POA: Diagnosis not present

## 2021-06-02 DIAGNOSIS — M779 Enthesopathy, unspecified: Secondary | ICD-10-CM | POA: Diagnosis not present

## 2021-06-02 DIAGNOSIS — M778 Other enthesopathies, not elsewhere classified: Secondary | ICD-10-CM

## 2021-06-02 NOTE — Progress Notes (Signed)
Subjective:   Patient ID: [name] [name], female   DOB: 70 y.o.   MRN: [id number]   HPI Patient presents stating that her right foot flared up and her left foot on top is been sore and she is worried about stress fracture and states she is doing somewhat better from having had the area that we worked on originally   ROS      Objective:  Physical Exam  Neurovascular status intact with inflammation of the subthird metatarsal phalangeal joint improved with discomfort on the dorsum of the right foot dorsum of the left foot more in the metatarsal shafts     Assessment:  Possibility for inflammatory capsulitis tendinitis versus stress fracture with improved capsulitis of the third MPJ left     Plan:  8 NP x-ray reviewed advised on ice therapy rigid bottom shoes and that this should be Pettey-limiting.  Patient encouraged to call questions concerns if symptoms were to persist  X-rays indicate everything appears to be healing well no indications of current bone pathology

## 2021-06-19 DIAGNOSIS — D2262 Melanocytic nevi of left upper limb, including shoulder: Secondary | ICD-10-CM | POA: Diagnosis not present

## 2021-06-19 DIAGNOSIS — C44519 Basal cell carcinoma of skin of other part of trunk: Secondary | ICD-10-CM | POA: Diagnosis not present

## 2021-06-19 DIAGNOSIS — Z85820 Personal history of malignant melanoma of skin: Secondary | ICD-10-CM | POA: Diagnosis not present

## 2021-06-19 DIAGNOSIS — L821 Other seborrheic keratosis: Secondary | ICD-10-CM | POA: Diagnosis not present

## 2021-06-19 DIAGNOSIS — L570 Actinic keratosis: Secondary | ICD-10-CM | POA: Diagnosis not present

## 2021-06-19 DIAGNOSIS — L820 Inflamed seborrheic keratosis: Secondary | ICD-10-CM | POA: Diagnosis not present

## 2021-06-19 DIAGNOSIS — D2239 Melanocytic nevi of other parts of face: Secondary | ICD-10-CM | POA: Diagnosis not present

## 2021-06-19 DIAGNOSIS — L814 Other melanin hyperpigmentation: Secondary | ICD-10-CM | POA: Diagnosis not present

## 2021-06-19 DIAGNOSIS — D225 Melanocytic nevi of trunk: Secondary | ICD-10-CM | POA: Diagnosis not present

## 2021-06-19 DIAGNOSIS — Z85828 Personal history of other malignant neoplasm of skin: Secondary | ICD-10-CM | POA: Diagnosis not present

## 2021-06-19 DIAGNOSIS — D2271 Melanocytic nevi of right lower limb, including hip: Secondary | ICD-10-CM | POA: Diagnosis not present

## 2021-07-01 DIAGNOSIS — H52203 Unspecified astigmatism, bilateral: Secondary | ICD-10-CM | POA: Diagnosis not present

## 2021-07-01 DIAGNOSIS — H524 Presbyopia: Secondary | ICD-10-CM | POA: Diagnosis not present

## 2021-07-01 DIAGNOSIS — H5203 Hypermetropia, bilateral: Secondary | ICD-10-CM | POA: Diagnosis not present

## 2021-07-01 DIAGNOSIS — E1136 Type 2 diabetes mellitus with diabetic cataract: Secondary | ICD-10-CM | POA: Diagnosis not present

## 2021-07-01 DIAGNOSIS — H2513 Age-related nuclear cataract, bilateral: Secondary | ICD-10-CM | POA: Diagnosis not present

## 2021-07-10 DIAGNOSIS — R059 Cough, unspecified: Secondary | ICD-10-CM | POA: Diagnosis not present

## 2021-07-10 DIAGNOSIS — J029 Acute pharyngitis, unspecified: Secondary | ICD-10-CM | POA: Diagnosis not present

## 2021-07-10 DIAGNOSIS — B349 Viral infection, unspecified: Secondary | ICD-10-CM | POA: Diagnosis not present

## 2021-07-10 DIAGNOSIS — Z03818 Encounter for observation for suspected exposure to other biological agents ruled out: Secondary | ICD-10-CM | POA: Diagnosis not present

## 2021-08-19 DIAGNOSIS — E890 Postprocedural hypothyroidism: Secondary | ICD-10-CM | POA: Diagnosis not present

## 2021-08-19 DIAGNOSIS — C73 Malignant neoplasm of thyroid gland: Secondary | ICD-10-CM | POA: Diagnosis not present

## 2021-09-17 DIAGNOSIS — L570 Actinic keratosis: Secondary | ICD-10-CM | POA: Diagnosis not present

## 2021-09-17 DIAGNOSIS — Z85828 Personal history of other malignant neoplasm of skin: Secondary | ICD-10-CM | POA: Diagnosis not present

## 2021-09-17 DIAGNOSIS — L814 Other melanin hyperpigmentation: Secondary | ICD-10-CM | POA: Diagnosis not present

## 2021-09-17 DIAGNOSIS — D485 Neoplasm of uncertain behavior of skin: Secondary | ICD-10-CM | POA: Diagnosis not present

## 2021-09-17 DIAGNOSIS — L820 Inflamed seborrheic keratosis: Secondary | ICD-10-CM | POA: Diagnosis not present

## 2021-09-17 DIAGNOSIS — D225 Melanocytic nevi of trunk: Secondary | ICD-10-CM | POA: Diagnosis not present

## 2021-09-17 DIAGNOSIS — Z85820 Personal history of malignant melanoma of skin: Secondary | ICD-10-CM | POA: Diagnosis not present

## 2021-09-17 DIAGNOSIS — L821 Other seborrheic keratosis: Secondary | ICD-10-CM | POA: Diagnosis not present

## 2021-09-26 DIAGNOSIS — Z01419 Encounter for gynecological examination (general) (routine) without abnormal findings: Secondary | ICD-10-CM | POA: Diagnosis not present

## 2021-09-29 DIAGNOSIS — Z85820 Personal history of malignant melanoma of skin: Secondary | ICD-10-CM | POA: Diagnosis not present

## 2021-09-29 DIAGNOSIS — L988 Other specified disorders of the skin and subcutaneous tissue: Secondary | ICD-10-CM | POA: Diagnosis not present

## 2021-09-29 DIAGNOSIS — D485 Neoplasm of uncertain behavior of skin: Secondary | ICD-10-CM | POA: Diagnosis not present

## 2021-10-31 DIAGNOSIS — M810 Age-related osteoporosis without current pathological fracture: Secondary | ICD-10-CM | POA: Diagnosis not present

## 2021-10-31 DIAGNOSIS — Z780 Asymptomatic menopausal state: Secondary | ICD-10-CM | POA: Diagnosis not present

## 2021-11-26 DIAGNOSIS — R252 Cramp and spasm: Secondary | ICD-10-CM | POA: Diagnosis not present

## 2021-11-26 DIAGNOSIS — E890 Postprocedural hypothyroidism: Secondary | ICD-10-CM | POA: Diagnosis not present

## 2021-11-26 DIAGNOSIS — E119 Type 2 diabetes mellitus without complications: Secondary | ICD-10-CM | POA: Diagnosis not present

## 2021-11-26 DIAGNOSIS — M810 Age-related osteoporosis without current pathological fracture: Secondary | ICD-10-CM | POA: Diagnosis not present

## 2021-11-26 DIAGNOSIS — E7800 Pure hypercholesterolemia, unspecified: Secondary | ICD-10-CM | POA: Diagnosis not present

## 2022-01-05 DIAGNOSIS — D1801 Hemangioma of skin and subcutaneous tissue: Secondary | ICD-10-CM | POA: Diagnosis not present

## 2022-01-05 DIAGNOSIS — D225 Melanocytic nevi of trunk: Secondary | ICD-10-CM | POA: Diagnosis not present

## 2022-01-05 DIAGNOSIS — Z85820 Personal history of malignant melanoma of skin: Secondary | ICD-10-CM | POA: Diagnosis not present

## 2022-01-05 DIAGNOSIS — L570 Actinic keratosis: Secondary | ICD-10-CM | POA: Diagnosis not present

## 2022-01-05 DIAGNOSIS — Z85828 Personal history of other malignant neoplasm of skin: Secondary | ICD-10-CM | POA: Diagnosis not present

## 2022-01-05 DIAGNOSIS — L821 Other seborrheic keratosis: Secondary | ICD-10-CM | POA: Diagnosis not present

## 2022-01-05 DIAGNOSIS — D2272 Melanocytic nevi of left lower limb, including hip: Secondary | ICD-10-CM | POA: Diagnosis not present

## 2022-01-05 DIAGNOSIS — L814 Other melanin hyperpigmentation: Secondary | ICD-10-CM | POA: Diagnosis not present

## 2022-01-05 DIAGNOSIS — D2271 Melanocytic nevi of right lower limb, including hip: Secondary | ICD-10-CM | POA: Diagnosis not present

## 2022-04-06 DIAGNOSIS — R252 Cramp and spasm: Secondary | ICD-10-CM | POA: Diagnosis not present

## 2022-04-07 DIAGNOSIS — H1132 Conjunctival hemorrhage, left eye: Secondary | ICD-10-CM | POA: Diagnosis not present

## 2022-04-17 DIAGNOSIS — Z85820 Personal history of malignant melanoma of skin: Secondary | ICD-10-CM | POA: Diagnosis not present

## 2022-04-17 DIAGNOSIS — C44722 Squamous cell carcinoma of skin of right lower limb, including hip: Secondary | ICD-10-CM | POA: Diagnosis not present

## 2022-04-30 DIAGNOSIS — H00024 Hordeolum internum left upper eyelid: Secondary | ICD-10-CM | POA: Diagnosis not present

## 2022-05-07 DIAGNOSIS — H0014 Chalazion left upper eyelid: Secondary | ICD-10-CM | POA: Diagnosis not present

## 2022-05-11 DIAGNOSIS — Z1231 Encounter for screening mammogram for malignant neoplasm of breast: Secondary | ICD-10-CM | POA: Diagnosis not present

## 2022-05-18 DIAGNOSIS — H0102B Squamous blepharitis left eye, upper and lower eyelids: Secondary | ICD-10-CM | POA: Diagnosis not present

## 2022-05-18 DIAGNOSIS — H0102A Squamous blepharitis right eye, upper and lower eyelids: Secondary | ICD-10-CM | POA: Diagnosis not present

## 2022-05-27 DIAGNOSIS — H52223 Regular astigmatism, bilateral: Secondary | ICD-10-CM | POA: Diagnosis not present

## 2022-05-29 DIAGNOSIS — H1133 Conjunctival hemorrhage, bilateral: Secondary | ICD-10-CM | POA: Diagnosis not present

## 2022-05-29 DIAGNOSIS — E890 Postprocedural hypothyroidism: Secondary | ICD-10-CM | POA: Diagnosis not present

## 2022-05-29 DIAGNOSIS — E119 Type 2 diabetes mellitus without complications: Secondary | ICD-10-CM | POA: Diagnosis not present

## 2022-05-29 DIAGNOSIS — E7800 Pure hypercholesterolemia, unspecified: Secondary | ICD-10-CM | POA: Diagnosis not present

## 2022-05-29 DIAGNOSIS — K2100 Gastro-esophageal reflux disease with esophagitis, without bleeding: Secondary | ICD-10-CM | POA: Diagnosis not present

## 2022-06-13 IMAGING — CR DG CHEST 2V
2 series · 2 of 2 positions shown · non-contrast
Comparison: No priors.

CLINICAL DATA: 68-year-old female with history of jugular venous
distention.

EXAM:
CHEST - 2 VIEW

[w chest pa]
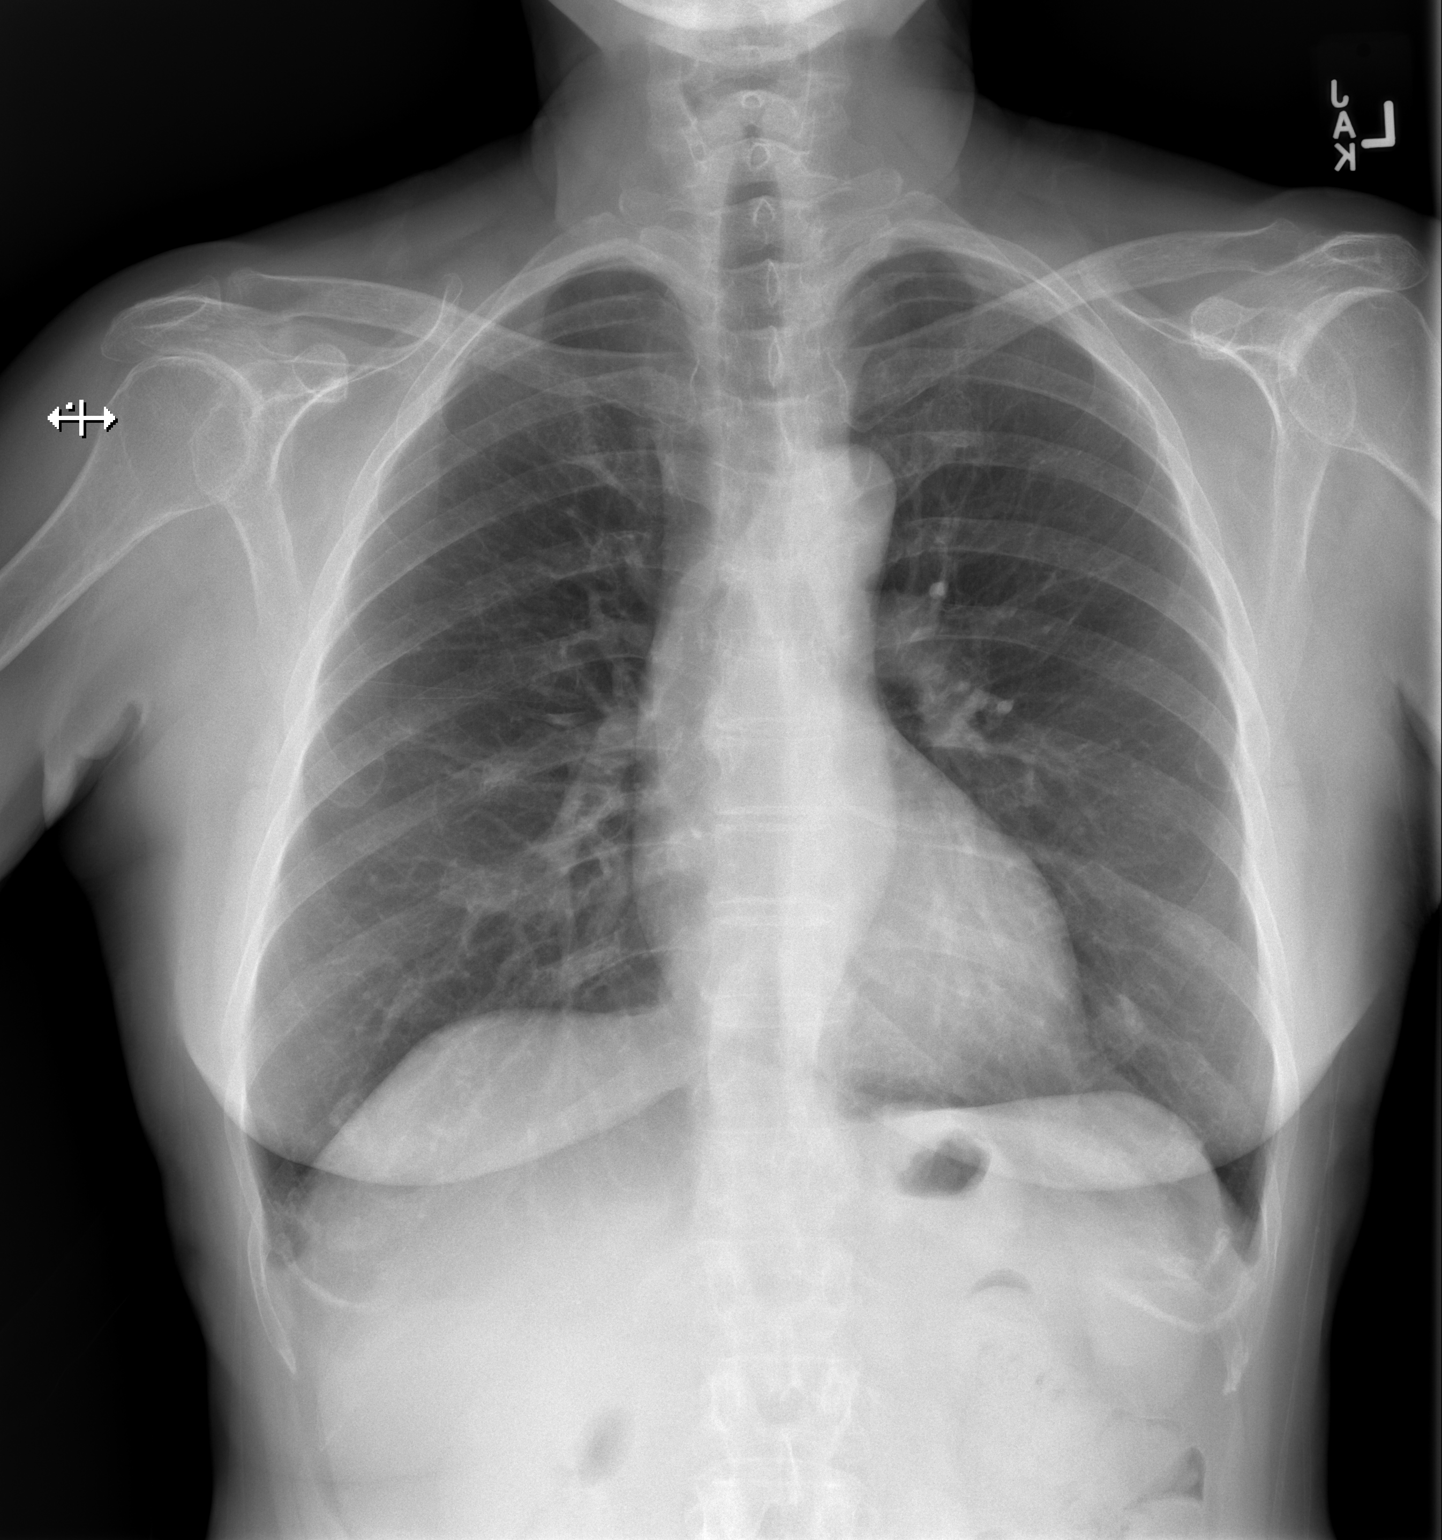

[w chest lat]
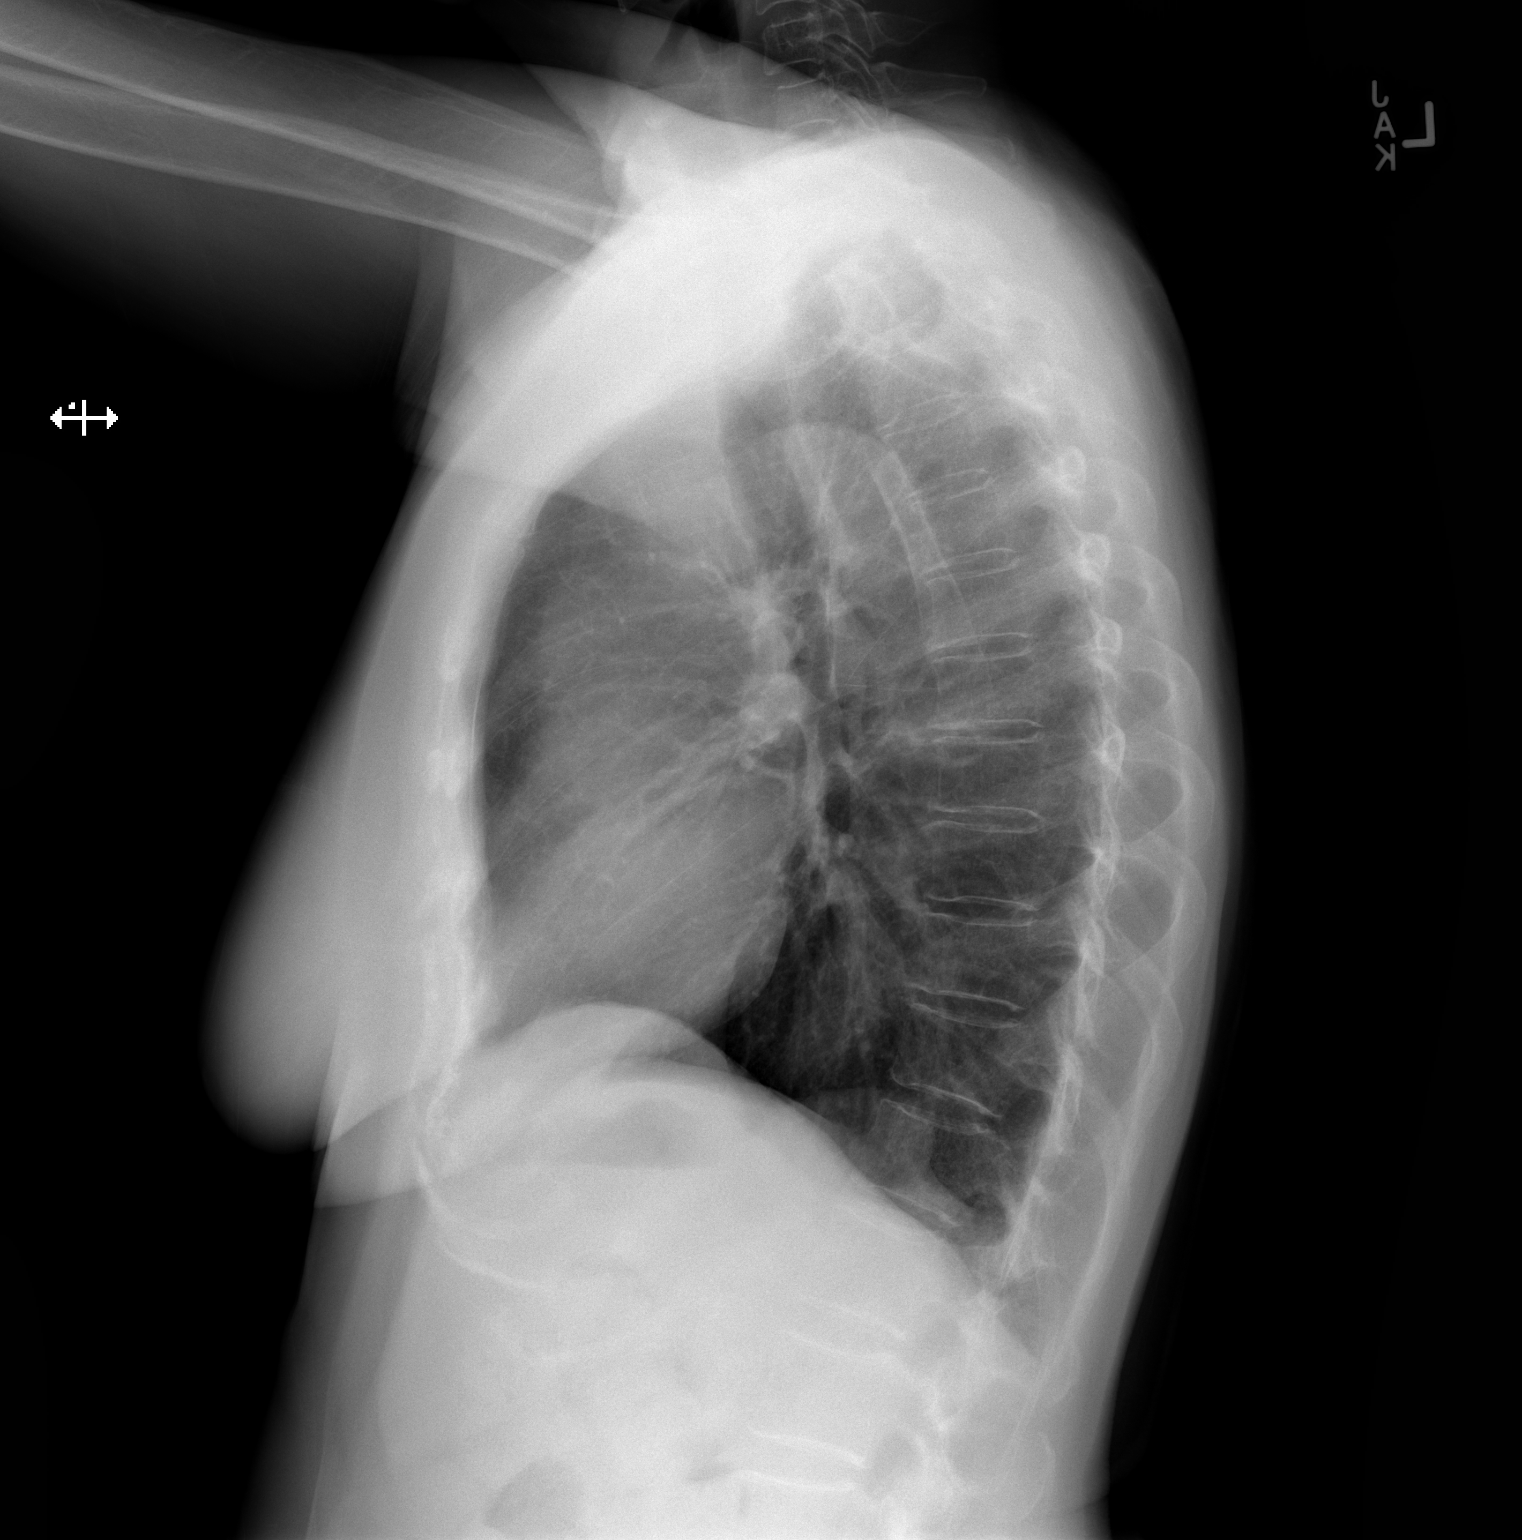

[2 of 2 positions shown; findings below may reference images not displayed]

FINDINGS: Lung volumes are normal. No consolidative airspace disease. No
pleural effusions. No pneumothorax. No pulmonary nodule or mass
noted. Pulmonary vasculature and the cardiomediastinal silhouette
are within normal limits.
IMPRESSION: No radiographic evidence of acute cardiopulmonary disease.

## 2022-06-22 DIAGNOSIS — H5789 Other specified disorders of eye and adnexa: Secondary | ICD-10-CM | POA: Diagnosis not present

## 2022-06-22 DIAGNOSIS — H01009 Unspecified blepharitis unspecified eye, unspecified eyelid: Secondary | ICD-10-CM | POA: Diagnosis not present

## 2022-07-07 DIAGNOSIS — L821 Other seborrheic keratosis: Secondary | ICD-10-CM | POA: Diagnosis not present

## 2022-07-07 DIAGNOSIS — L814 Other melanin hyperpigmentation: Secondary | ICD-10-CM | POA: Diagnosis not present

## 2022-07-07 DIAGNOSIS — D0471 Carcinoma in situ of skin of right lower limb, including hip: Secondary | ICD-10-CM | POA: Diagnosis not present

## 2022-07-07 DIAGNOSIS — L820 Inflamed seborrheic keratosis: Secondary | ICD-10-CM | POA: Diagnosis not present

## 2022-07-07 DIAGNOSIS — L570 Actinic keratosis: Secondary | ICD-10-CM | POA: Diagnosis not present

## 2022-07-07 DIAGNOSIS — Z85820 Personal history of malignant melanoma of skin: Secondary | ICD-10-CM | POA: Diagnosis not present

## 2022-07-07 DIAGNOSIS — Z85828 Personal history of other malignant neoplasm of skin: Secondary | ICD-10-CM | POA: Diagnosis not present

## 2022-07-09 DIAGNOSIS — H01024 Squamous blepharitis left upper eyelid: Secondary | ICD-10-CM | POA: Diagnosis not present

## 2022-07-09 DIAGNOSIS — Z9889 Other specified postprocedural states: Secondary | ICD-10-CM | POA: Diagnosis not present

## 2022-07-09 DIAGNOSIS — H04123 Dry eye syndrome of bilateral lacrimal glands: Secondary | ICD-10-CM | POA: Diagnosis not present

## 2022-07-09 DIAGNOSIS — H10403 Unspecified chronic conjunctivitis, bilateral: Secondary | ICD-10-CM | POA: Diagnosis not present

## 2022-07-09 DIAGNOSIS — H01021 Squamous blepharitis right upper eyelid: Secondary | ICD-10-CM | POA: Diagnosis not present

## 2022-08-11 DIAGNOSIS — E890 Postprocedural hypothyroidism: Secondary | ICD-10-CM | POA: Diagnosis not present

## 2022-08-11 DIAGNOSIS — C73 Malignant neoplasm of thyroid gland: Secondary | ICD-10-CM | POA: Diagnosis not present

## 2022-08-19 DIAGNOSIS — C73 Malignant neoplasm of thyroid gland: Secondary | ICD-10-CM | POA: Diagnosis not present

## 2022-08-19 DIAGNOSIS — E890 Postprocedural hypothyroidism: Secondary | ICD-10-CM | POA: Diagnosis not present

## 2022-09-03 DIAGNOSIS — E890 Postprocedural hypothyroidism: Secondary | ICD-10-CM | POA: Diagnosis not present

## 2022-10-05 DIAGNOSIS — R69 Illness, unspecified: Secondary | ICD-10-CM | POA: Diagnosis not present

## 2022-10-20 DIAGNOSIS — C73 Malignant neoplasm of thyroid gland: Secondary | ICD-10-CM | POA: Diagnosis not present

## 2022-10-20 DIAGNOSIS — E890 Postprocedural hypothyroidism: Secondary | ICD-10-CM | POA: Diagnosis not present

## 2022-12-07 DIAGNOSIS — E7800 Pure hypercholesterolemia, unspecified: Secondary | ICD-10-CM | POA: Diagnosis not present

## 2022-12-07 DIAGNOSIS — E890 Postprocedural hypothyroidism: Secondary | ICD-10-CM | POA: Diagnosis not present

## 2022-12-07 DIAGNOSIS — E119 Type 2 diabetes mellitus without complications: Secondary | ICD-10-CM | POA: Diagnosis not present

## 2022-12-07 DIAGNOSIS — E118 Type 2 diabetes mellitus with unspecified complications: Secondary | ICD-10-CM | POA: Diagnosis not present

## 2022-12-11 ENCOUNTER — Other Ambulatory Visit (HOSPITAL_COMMUNITY): Payer: Self-pay | Admitting: Physician Assistant

## 2022-12-11 DIAGNOSIS — E890 Postprocedural hypothyroidism: Secondary | ICD-10-CM | POA: Diagnosis not present

## 2022-12-11 DIAGNOSIS — E782 Mixed hyperlipidemia: Secondary | ICD-10-CM | POA: Diagnosis not present

## 2022-12-31 ENCOUNTER — Ambulatory Visit (HOSPITAL_COMMUNITY)
Admission: RE | Admit: 2022-12-31 | Discharge: 2022-12-31 | Disposition: A | Discharge status: Home / Self Care | Payer: Managed Care, Other (non HMO) | Source: Ambulatory Visit | Attending: Physician Assistant | Admitting: Physician Assistant

## 2022-12-31 DIAGNOSIS — E782 Mixed hyperlipidemia: Secondary | ICD-10-CM | POA: Insufficient documentation

## 2023-01-05 DIAGNOSIS — H25013 Cortical age-related cataract, bilateral: Secondary | ICD-10-CM | POA: Diagnosis not present

## 2023-01-05 DIAGNOSIS — H0102A Squamous blepharitis right eye, upper and lower eyelids: Secondary | ICD-10-CM | POA: Diagnosis not present

## 2023-01-05 DIAGNOSIS — H0102B Squamous blepharitis left eye, upper and lower eyelids: Secondary | ICD-10-CM | POA: Diagnosis not present

## 2023-01-05 DIAGNOSIS — H16223 Keratoconjunctivitis sicca, not specified as Sjogren's, bilateral: Secondary | ICD-10-CM | POA: Diagnosis not present

## 2023-01-08 DIAGNOSIS — L570 Actinic keratosis: Secondary | ICD-10-CM | POA: Diagnosis not present

## 2023-01-08 DIAGNOSIS — D1801 Hemangioma of skin and subcutaneous tissue: Secondary | ICD-10-CM | POA: Diagnosis not present

## 2023-01-08 DIAGNOSIS — L814 Other melanin hyperpigmentation: Secondary | ICD-10-CM | POA: Diagnosis not present

## 2023-01-08 DIAGNOSIS — Z85828 Personal history of other malignant neoplasm of skin: Secondary | ICD-10-CM | POA: Diagnosis not present

## 2023-01-08 DIAGNOSIS — Z85820 Personal history of malignant melanoma of skin: Secondary | ICD-10-CM | POA: Diagnosis not present

## 2023-01-08 DIAGNOSIS — C44619 Basal cell carcinoma of skin of left upper limb, including shoulder: Secondary | ICD-10-CM | POA: Diagnosis not present

## 2023-01-08 DIAGNOSIS — L821 Other seborrheic keratosis: Secondary | ICD-10-CM | POA: Diagnosis not present

## 2023-01-08 DIAGNOSIS — D2261 Melanocytic nevi of right upper limb, including shoulder: Secondary | ICD-10-CM | POA: Diagnosis not present

## 2023-01-12 ENCOUNTER — Ambulatory Visit
Admission: RE | Admit: 2023-01-12 | Discharge: 2023-01-12 | Disposition: A | Discharge status: Home / Self Care | Payer: Medicare HMO | Source: Ambulatory Visit | Attending: Physician Assistant | Admitting: Physician Assistant

## 2023-01-12 ENCOUNTER — Other Ambulatory Visit: Payer: Self-pay | Admitting: Physician Assistant

## 2023-01-12 DIAGNOSIS — M25562 Pain in left knee: Secondary | ICD-10-CM

## 2023-01-12 DIAGNOSIS — E782 Mixed hyperlipidemia: Secondary | ICD-10-CM | POA: Diagnosis not present

## 2023-01-12 DIAGNOSIS — I77810 Thoracic aortic ectasia: Secondary | ICD-10-CM | POA: Diagnosis not present

## 2023-01-28 DIAGNOSIS — I7121 Aneurysm of the ascending aorta, without rupture: Secondary | ICD-10-CM | POA: Insufficient documentation

## 2023-01-28 DIAGNOSIS — R931 Abnormal findings on diagnostic imaging of heart and coronary circulation: Secondary | ICD-10-CM | POA: Insufficient documentation

## 2023-01-28 NOTE — Progress Notes (Signed)
Patient referred by Noberto Retort, MD for ascending aorta aneurysm  Subjective:   [name] [name], female    DOB: [birth year], 72 y.o.   MRN: [id number]   Chief Complaint  Patient presents with   New Patient (Initial Visit)   TAA   Elevated coronary calcium score   Long QT Syndrome    HPI  72 y.o.  Caucasian female with hypothyroidism s/p thyroidectomy, mixed hyperlipidemia, elevated coronary calcium score, dilated ascending aorta  Patient is here with her husband. She denies chest pain, shortness of breath, palpitations, leg edema, orthopnea, PND, TIA/syncope. On recent CT scan, calcium score was 61, ascending aorta was dilated at 40.5 mm. Patient has had hyperlipidemia, not been on any lipid lowering therapy. Blood pressure is elevated today, generally normal.    History reviewed. No pertinent past medical history.   Past Surgical History:  Procedure Laterality Date   APPENDECTOMY     EYE SURGERY     THYROID SURGERY     TUBAL LIGATION       Social History   Tobacco Use  Smoking Status Not on file  Smokeless Tobacco Not on file    Social History   Substance and Sexual Activity  Alcohol Use Not on file     No family history on file.    Current Outpatient Medications:    Calcium Carb-Cholecalciferol 600-5 MG-MCG TABS, 2 capsules, Disp: , Rfl:    Garlic 1000 MG CAPS, Take by mouth., Disp: , Rfl:    Levothyroxine Sodium 125 MCG CAPS, Take 125 mcg by mouth daily before breakfast., Disp: , Rfl:    Multiple Vitamin (MULTI-VITAMIN) tablet, Take 1 tablet by mouth daily., Disp: , Rfl:    NON FORMULARY, Provasil (memory boost), Disp: , Rfl:    Omega 3 1000 MG CAPS, 1 capsule with a meal, Disp: , Rfl:    Red Yeast Rice Extract ([employer] RED YEAST RICE) 600 MG CAPS, See admin instructions., Disp: , Rfl:    Saccharomyces boulardii (PROBIOTIC) 250 MG CAPS, 1 capsule, Disp: , Rfl:    Cardiovascular and other pertinent studies:  Reviewed external labs and tests,  independently interpreted  EKG 01/29/2023: Sinus rhythm 79 bpm Normal EKG   CT cardiac scoring 12/31/2022: Total score: 61 (60th percentile).  Significant dilatation of the ascending thoracic aorta 40.5 mm consider f/u gated chest CTA to further evaluate   Recent labs: 12/07/2022: Glucose 122, BUN/Cr 52/0.74. EGFR 86. Na/K 139/4.2. Rest of the CMP normal H/H 14/43. MCV 91. Platelets 313 HbA1C 6.6% Chol 278, TG 183, HDL 69, LDL 176 TSH 0.31 low Free T4 1.19 high    Review of Systems  Cardiovascular:  Negative for chest pain, dyspnea on exertion, leg swelling, palpitations and syncope.       Vitals:   01/29/23 1043 01/29/23 1050  BP: (!) 153/93 (!) 151/97  Pulse: 84 81  Resp: 16   SpO2: 97%      Body mass index is 20.81 kg/m. Filed Weights   01/29/23 1043  Weight: 113 lb 12.8 oz (51.6 kg)     Objective:   Physical Exam Vitals and nursing note reviewed.  Constitutional:      General: She is not in acute distress. Neck:     Vascular: No JVD.  Cardiovascular:     Rate and Rhythm: Normal rate and regular rhythm.     Heart sounds: Normal heart sounds. No murmur heard. Pulmonary:     Effort: Pulmonary effort is normal.  Breath sounds: Normal breath sounds. No wheezing or rales.  Musculoskeletal:     Right lower leg: No edema.     Left lower leg: No edema.          Visit diagnoses:   ICD-10-CM   1. Aneurysm of ascending aorta without rupture (HCC)  I71.21 EKG 12-Lead    CT ANGIO CHEST AORTA W/CM & OR WO/CM    2. Elevated coronary artery calcium score  R93.1 rosuvastatin (CRESTOR) 10 MG tablet    Lipid panel    Lipid panel    3. Mixed hyperlipidemia  E78.2 rosuvastatin (CRESTOR) 10 MG tablet    Lipid panel    Lipid panel    4. Elevated blood pressure reading without diagnosis of hypertension  R03.0        Orders Placed This Encounter  Procedures   CT ANGIO CHEST AORTA W/CM & OR WO/CM   Lipid panel   EKG 12-Lead       Meds ordered this  encounter  Medications   rosuvastatin (CRESTOR) 10 MG tablet    Sig: Take 1 tablet (10 mg total) by mouth daily.    Dispense:  30 tablet    Refill:  3     Assessment & Recommendations:   72 y.o.  Caucasian female with hypothyroidism s/p thyroidectomy, mixed hyperlipidemia, elevated coronary calcium score, dilated ascending aorta  Elevated coronary calcium score, mixed hyperlipidemia: Chol 278, TG 183, HDL 69, LDL 176 Calcium score 60th percentile.  In addition di heart healthy lifestyle, recommend statin. After much discussion, we have mutually agreed on Crestor 10 mg daily.  Repeat lipid panel in 2 months.  Dilated ascending aorta: 40.5 mm on non contrast CT scan. Check CTA. Need to control BP and HR, BP is generally controlled. She will monitor at home. Will reassess at follow up visit. Avoid lifting heavy weights.  Further recommendations after above testing  Thank you for referring the patient to [place]. Please feel free to contact with any questions.   Elder Negus, MD Pager: [phone] Office: [phone]

## 2023-01-29 ENCOUNTER — Ambulatory Visit: Payer: Medicare HMO | Admitting: Cardiology

## 2023-01-29 ENCOUNTER — Encounter: Payer: Self-pay | Admitting: Cardiology

## 2023-01-29 VITALS — BP 151/97 | HR 81 | Resp 16 | Ht 62.0 in | Wt 113.8 lb

## 2023-01-29 DIAGNOSIS — E782 Mixed hyperlipidemia: Secondary | ICD-10-CM | POA: Insufficient documentation

## 2023-01-29 DIAGNOSIS — R931 Abnormal findings on diagnostic imaging of heart and coronary circulation: Secondary | ICD-10-CM

## 2023-01-29 DIAGNOSIS — E785 Hyperlipidemia, unspecified: Secondary | ICD-10-CM | POA: Insufficient documentation

## 2023-01-29 DIAGNOSIS — I7121 Aneurysm of the ascending aorta, without rupture: Secondary | ICD-10-CM

## 2023-01-29 DIAGNOSIS — R030 Elevated blood-pressure reading, without diagnosis of hypertension: Secondary | ICD-10-CM | POA: Insufficient documentation

## 2023-01-29 MED ORDER — ROSUVASTATIN CALCIUM 10 MG PO TABS: 10.0000 mg | ORAL_TABLET | Freq: Every day | ORAL | 3 refills | Status: DC

## 2023-02-01 ENCOUNTER — Encounter: Payer: Self-pay | Admitting: Cardiology

## 2023-02-01 NOTE — Telephone Encounter (Signed)
 From pt

## 2023-02-08 DIAGNOSIS — E890 Postprocedural hypothyroidism: Secondary | ICD-10-CM | POA: Diagnosis not present

## 2023-02-18 ENCOUNTER — Encounter: Payer: Self-pay | Admitting: Cardiology

## 2023-02-19 ENCOUNTER — Ambulatory Visit: Admission: RE | Admit: 2023-02-19 | Payer: Medicare HMO | Source: Ambulatory Visit

## 2023-02-19 DIAGNOSIS — I700 Atherosclerosis of aorta: Secondary | ICD-10-CM | POA: Diagnosis not present

## 2023-02-19 DIAGNOSIS — I7121 Aneurysm of the ascending aorta, without rupture: Secondary | ICD-10-CM | POA: Diagnosis not present

## 2023-02-19 MED ORDER — IOPAMIDOL (ISOVUE-370) INJECTION 76%
75.0000 mL | Freq: Once | INTRAVENOUS | Status: AC | PRN
  Administered 2023-02-19: 75 mL via INTRAVENOUS

## 2023-02-23 ENCOUNTER — Encounter: Payer: Self-pay | Admitting: Cardiology

## 2023-02-24 NOTE — Telephone Encounter (Signed)
From patient.

## 2023-02-24 NOTE — Telephone Encounter (Signed)
Correct. Spoke to the patient. You do not need to call. I will order the tests at the next visit in September.  Thanks MJP

## 2023-03-29 DIAGNOSIS — R931 Abnormal findings on diagnostic imaging of heart and coronary circulation: Secondary | ICD-10-CM | POA: Diagnosis not present

## 2023-03-29 DIAGNOSIS — E782 Mixed hyperlipidemia: Secondary | ICD-10-CM | POA: Diagnosis not present

## 2023-03-31 ENCOUNTER — Encounter: Payer: Self-pay | Admitting: Cardiology

## 2023-03-31 ENCOUNTER — Ambulatory Visit: Payer: Medicare HMO | Admitting: Cardiology

## 2023-03-31 VITALS — BP 157/90 | HR 80 | Resp 16 | Ht 62.0 in | Wt 113.6 lb

## 2023-03-31 DIAGNOSIS — I7121 Aneurysm of the ascending aorta, without rupture: Secondary | ICD-10-CM

## 2023-03-31 DIAGNOSIS — E782 Mixed hyperlipidemia: Secondary | ICD-10-CM | POA: Diagnosis not present

## 2023-03-31 MED ORDER — ATORVASTATIN CALCIUM 20 MG PO TABS: 20.0000 mg | ORAL_TABLET | Freq: Every day | ORAL | 5 refills | Status: DC

## 2023-03-31 NOTE — Progress Notes (Signed)
Patient referred by Noberto Retort, MD for ascending aorta aneurysm  Subjective:   [name] [name], female    DOB: 17-Jun-1951, 72 y.o.   MRN: [id number]   Chief Complaint  Patient presents with   Results    HPI  72 y.o.  Caucasian female with hypothyroidism s/p thyroidectomy, mixed hyperlipidemia, elevated coronary calcium score, dilated ascending aorta  Reviewed recent test results with the patient, details below. Blood pressure elevated today, but is lower at home. She did not tolerate Crestor du to severe myalgia.   Initial consultation visit 01/2023: Patient is here with her husband. She denies chest pain, shortness of breath, palpitations, leg edema, orthopnea, PND, TIA/syncope. On recent CT scan, calcium score was 61, ascending aorta was dilated at 40.5 mm. Patient has had hyperlipidemia, not been on any lipid lowering therapy. Blood pressure is elevated today, generally normal.      Current Outpatient Medications:    Calcium Carb-Cholecalciferol 600-5 MG-MCG TABS, 2 capsules, Disp: , Rfl:    Garlic 1000 MG CAPS, Take by mouth., Disp: , Rfl:    Levothyroxine Sodium 125 MCG CAPS, Take 125 mcg by mouth daily before breakfast., Disp: , Rfl:    Multiple Vitamin (MULTI-VITAMIN) tablet, Take 1 tablet by mouth daily., Disp: , Rfl:    NON FORMULARY, Provasil (memory boost), Disp: , Rfl:    Omega 3 1000 MG CAPS, 1 capsule with a meal, Disp: , Rfl:    Red Yeast Rice Extract ([employer] RED YEAST RICE) 600 MG CAPS, See admin instructions., Disp: , Rfl:    rosuvastatin (CRESTOR) 10 MG tablet, Take 1 tablet (10 mg total) by mouth daily., Disp: 30 tablet, Rfl: 3   Saccharomyces boulardii (PROBIOTIC) 250 MG CAPS, 1 capsule, Disp: , Rfl:    Cardiovascular and other pertinent studies:  Reviewed external labs and tests, independently interpreted  EKG 01/29/2023: Sinus rhythm 79 bpm Normal EKG  CT cardiac scoring 12/31/2022: Total score: 61 (60th percentile).  Significant dilatation of  the ascending thoracic aorta 40.5 mm consider f/u gated chest CTA to further evaluate  CTA aorta 02/19/2023: Aortic atherosclerosis with mild aneurysmal dilatation of the ascending aorta measuring 4.0 cm. Recommend annual imaging followup by CTA or MRA. This recommendation follows 2010 ACCF/AHA/AATS/ACR/ASA/SCA/SCAI/SIR/STS/SVM Guidelines for the Diagnosis and Management of Patients with Thoracic Aortic Disease. Circulation. 2010; 121: P295-J884. Aortic aneurysm NOS (ICD10-I71.9)    Recent labs: 03/29/2023: Chol 255, TG 159, HDL 63, LDL 163  12/07/2022: Glucose 122, BUN/Cr 52/0.74. EGFR 86. Na/K 139/4.2. Rest of the CMP normal H/H 14/43. MCV 91. Platelets 313 HbA1C 6.6% Chol 278, TG 183, HDL 69, LDL 176 TSH 0.31 low Free T4 1.19 high    Review of Systems  Cardiovascular:  Negative for chest pain, dyspnea on exertion, leg swelling, palpitations and syncope.       Vitals:   03/31/23 1505  BP: (!) 157/90  Pulse: 80  Resp: 16  SpO2: 98%     Body mass index is 20.78 kg/m. Filed Weights   03/31/23 1505  Weight: 113 lb 9.6 oz (51.5 kg)     Objective:   Physical Exam Vitals and nursing note reviewed.  Constitutional:      General: She is not in acute distress. Neck:     Vascular: No JVD.  Cardiovascular:     Rate and Rhythm: Normal rate and regular rhythm.     Heart sounds: Normal heart sounds. No murmur heard. Pulmonary:     Effort: Pulmonary effort is normal.  Breath sounds: Normal breath sounds. No wheezing or rales.  Musculoskeletal:     Right lower leg: No edema.     Left lower leg: No edema.          Visit diagnoses:   ICD-10-CM   1. Mixed hyperlipidemia  E78.2 atorvastatin (LIPITOR) 20 MG tablet    Lipid panel    Lipid panel    2. Aneurysm of ascending aorta without rupture (HCC)  I71.21 Basic metabolic panel    CT ANGIO CHEST AORTA W/CM & OR WO/CM        Orders Placed This Encounter  Procedures   CT ANGIO CHEST AORTA W/CM & OR WO/CM    Lipid panel   Basic metabolic panel       Meds ordered this encounter  Medications   atorvastatin (LIPITOR) 20 MG tablet    Sig: Take 1 tablet (20 mg total) by mouth daily.    Dispense:  30 tablet    Refill:  5     Assessment & Recommendations:   72 y.o.  Caucasian female with hypothyroidism s/p thyroidectomy, mixed hyperlipidemia, elevated coronary calcium score, dilated ascending aorta  Elevated coronary calcium score, mixed hyperlipidemia: Chol 255, TG 159, HDL 63, LDL 163 (03/2023). Calcium score 60th percentile.(12/2022). Did not tolerate Crestor due to myalgia. Will try Lipitor 20 mg daily. Repeat lipid panel in 3 months. If LDL remains >70, will use Zetia, next option will be Nexlitol, or Repatha.   Dilated ascending aorta: 4 cm on CTA (02/2023). BP and HR well controlled at home, per the aptient. Not keen on additional medications. Repeat CTA aorta in 02/2023.  F/u in 1 year    Elder Negus, MD Pager: [phone] Office: [phone]

## 2023-04-12 DIAGNOSIS — H25013 Cortical age-related cataract, bilateral: Secondary | ICD-10-CM | POA: Diagnosis not present

## 2023-04-12 DIAGNOSIS — H16223 Keratoconjunctivitis sicca, not specified as Sjogren's, bilateral: Secondary | ICD-10-CM | POA: Diagnosis not present

## 2023-04-12 DIAGNOSIS — E119 Type 2 diabetes mellitus without complications: Secondary | ICD-10-CM | POA: Diagnosis not present

## 2023-04-12 DIAGNOSIS — H2513 Age-related nuclear cataract, bilateral: Secondary | ICD-10-CM | POA: Diagnosis not present

## 2023-05-17 DIAGNOSIS — Z1231 Encounter for screening mammogram for malignant neoplasm of breast: Secondary | ICD-10-CM | POA: Diagnosis not present

## 2023-06-01 DIAGNOSIS — E119 Type 2 diabetes mellitus without complications: Secondary | ICD-10-CM | POA: Diagnosis not present

## 2023-06-01 DIAGNOSIS — K2100 Gastro-esophageal reflux disease with esophagitis, without bleeding: Secondary | ICD-10-CM | POA: Diagnosis not present

## 2023-06-01 DIAGNOSIS — E7800 Pure hypercholesterolemia, unspecified: Secondary | ICD-10-CM | POA: Diagnosis not present

## 2023-06-01 DIAGNOSIS — E890 Postprocedural hypothyroidism: Secondary | ICD-10-CM | POA: Diagnosis not present

## 2023-06-01 DIAGNOSIS — Z0000 Encounter for general adult medical examination without abnormal findings: Secondary | ICD-10-CM | POA: Diagnosis not present

## 2023-06-01 DIAGNOSIS — M810 Age-related osteoporosis without current pathological fracture: Secondary | ICD-10-CM | POA: Diagnosis not present

## 2023-06-01 DIAGNOSIS — E559 Vitamin D deficiency, unspecified: Secondary | ICD-10-CM | POA: Diagnosis not present

## 2023-06-09 DIAGNOSIS — H524 Presbyopia: Secondary | ICD-10-CM | POA: Diagnosis not present

## 2023-07-16 DIAGNOSIS — C44319 Basal cell carcinoma of skin of other parts of face: Secondary | ICD-10-CM | POA: Diagnosis not present

## 2023-07-16 DIAGNOSIS — Z85820 Personal history of malignant melanoma of skin: Secondary | ICD-10-CM | POA: Diagnosis not present

## 2023-07-16 DIAGNOSIS — L570 Actinic keratosis: Secondary | ICD-10-CM | POA: Diagnosis not present

## 2023-07-16 DIAGNOSIS — C44712 Basal cell carcinoma of skin of right lower limb, including hip: Secondary | ICD-10-CM | POA: Diagnosis not present

## 2023-07-16 DIAGNOSIS — L814 Other melanin hyperpigmentation: Secondary | ICD-10-CM | POA: Diagnosis not present

## 2023-07-16 DIAGNOSIS — D2271 Melanocytic nevi of right lower limb, including hip: Secondary | ICD-10-CM | POA: Diagnosis not present

## 2023-07-16 DIAGNOSIS — L821 Other seborrheic keratosis: Secondary | ICD-10-CM | POA: Diagnosis not present

## 2023-07-16 DIAGNOSIS — D225 Melanocytic nevi of trunk: Secondary | ICD-10-CM | POA: Diagnosis not present

## 2023-07-16 DIAGNOSIS — Z85828 Personal history of other malignant neoplasm of skin: Secondary | ICD-10-CM | POA: Diagnosis not present

## 2023-08-25 DIAGNOSIS — C44319 Basal cell carcinoma of skin of other parts of face: Secondary | ICD-10-CM | POA: Diagnosis not present

## 2023-08-25 DIAGNOSIS — Z85820 Personal history of malignant melanoma of skin: Secondary | ICD-10-CM | POA: Diagnosis not present

## 2023-11-29 DIAGNOSIS — E890 Postprocedural hypothyroidism: Secondary | ICD-10-CM | POA: Diagnosis not present

## 2023-12-28 DIAGNOSIS — D2272 Melanocytic nevi of left lower limb, including hip: Secondary | ICD-10-CM | POA: Diagnosis not present

## 2023-12-28 DIAGNOSIS — Z85820 Personal history of malignant melanoma of skin: Secondary | ICD-10-CM | POA: Diagnosis not present

## 2023-12-28 DIAGNOSIS — L821 Other seborrheic keratosis: Secondary | ICD-10-CM | POA: Diagnosis not present

## 2023-12-28 DIAGNOSIS — D0462 Carcinoma in situ of skin of left upper limb, including shoulder: Secondary | ICD-10-CM | POA: Diagnosis not present

## 2023-12-28 DIAGNOSIS — D225 Melanocytic nevi of trunk: Secondary | ICD-10-CM | POA: Diagnosis not present

## 2023-12-28 DIAGNOSIS — D044 Carcinoma in situ of skin of scalp and neck: Secondary | ICD-10-CM | POA: Diagnosis not present

## 2023-12-28 DIAGNOSIS — L814 Other melanin hyperpigmentation: Secondary | ICD-10-CM | POA: Diagnosis not present

## 2023-12-28 DIAGNOSIS — D2271 Melanocytic nevi of right lower limb, including hip: Secondary | ICD-10-CM | POA: Diagnosis not present

## 2023-12-28 DIAGNOSIS — L570 Actinic keratosis: Secondary | ICD-10-CM | POA: Diagnosis not present

## 2023-12-28 DIAGNOSIS — Z85828 Personal history of other malignant neoplasm of skin: Secondary | ICD-10-CM | POA: Diagnosis not present

## 2023-12-28 DIAGNOSIS — D2262 Melanocytic nevi of left upper limb, including shoulder: Secondary | ICD-10-CM | POA: Diagnosis not present

## 2023-12-28 DIAGNOSIS — D2261 Melanocytic nevi of right upper limb, including shoulder: Secondary | ICD-10-CM | POA: Diagnosis not present

## 2024-02-04 ENCOUNTER — Encounter (HOSPITAL_COMMUNITY): Payer: Self-pay | Admitting: Internal Medicine

## 2024-02-04 ENCOUNTER — Emergency Department (HOSPITAL_BASED_OUTPATIENT_CLINIC_OR_DEPARTMENT_OTHER)

## 2024-02-04 ENCOUNTER — Observation Stay (HOSPITAL_COMMUNITY)

## 2024-02-04 ENCOUNTER — Observation Stay (HOSPITAL_BASED_OUTPATIENT_CLINIC_OR_DEPARTMENT_OTHER)
Admission: EM | Admit: 2024-02-04 | Discharge: 2024-02-05 | Disposition: A | Discharge status: Home / Self Care | Attending: Emergency Medicine | Admitting: Emergency Medicine

## 2024-02-04 DIAGNOSIS — E7849 Other hyperlipidemia: Secondary | ICD-10-CM

## 2024-02-04 DIAGNOSIS — Z8679 Personal history of other diseases of the circulatory system: Secondary | ICD-10-CM | POA: Insufficient documentation

## 2024-02-04 DIAGNOSIS — R030 Elevated blood-pressure reading, without diagnosis of hypertension: Secondary | ICD-10-CM

## 2024-02-04 DIAGNOSIS — G459 Transient cerebral ischemic attack, unspecified: Secondary | ICD-10-CM | POA: Diagnosis not present

## 2024-02-04 DIAGNOSIS — Z743 Need for continuous supervision: Secondary | ICD-10-CM | POA: Diagnosis not present

## 2024-02-04 DIAGNOSIS — R9082 White matter disease, unspecified: Secondary | ICD-10-CM | POA: Diagnosis not present

## 2024-02-04 DIAGNOSIS — Z79899 Other long term (current) drug therapy: Secondary | ICD-10-CM | POA: Diagnosis not present

## 2024-02-04 DIAGNOSIS — R202 Paresthesia of skin: Secondary | ICD-10-CM | POA: Diagnosis not present

## 2024-02-04 DIAGNOSIS — E785 Hyperlipidemia, unspecified: Secondary | ICD-10-CM | POA: Diagnosis not present

## 2024-02-04 DIAGNOSIS — R000 Tachycardia, unspecified: Secondary | ICD-10-CM | POA: Diagnosis not present

## 2024-02-04 DIAGNOSIS — E039 Hypothyroidism, unspecified: Secondary | ICD-10-CM | POA: Insufficient documentation

## 2024-02-04 DIAGNOSIS — R4701 Aphasia: Principal | ICD-10-CM

## 2024-02-04 DIAGNOSIS — I10 Essential (primary) hypertension: Secondary | ICD-10-CM | POA: Diagnosis not present

## 2024-02-04 LAB — URINE DRUG SCREEN
Amphetamines: NOT DETECTED
Barbiturates: NOT DETECTED
Benzodiazepines: NOT DETECTED
Cocaine: NOT DETECTED
Fentanyl: NOT DETECTED
Methadone Scn, Ur: NOT DETECTED
Opiates: NOT DETECTED
Tetrahydrocannabinol: NOT DETECTED

## 2024-02-04 LAB — COMPREHENSIVE METABOLIC PANEL WITH GFR
ALT: 16 U/L (ref 0–44)
AST: 24 U/L (ref 15–41)
Albumin: 4.4 g/dL (ref 3.5–5.0)
Alkaline Phosphatase: 85 U/L (ref 38–126)
Anion gap: 12 (ref 5–15)
BUN: 15 mg/dL (ref 8–23)
CO2: 27 mmol/L (ref 22–32)
Calcium: 9.3 mg/dL (ref 8.9–10.3)
Chloride: 99 mmol/L (ref 98–111)
Creatinine, Ser: 0.71 mg/dL (ref 0.44–1.00)
GFR, Estimated: >60 mL/min (ref >60)
Glucose, Bld: 208 mg/dL — ABNORMAL HIGH (ref 70–99)
Potassium: 3.5 mmol/L (ref 3.5–5.1)
Sodium: 137 mmol/L (ref 135–145)
Total Bilirubin: 0.4 mg/dL (ref 0.0–1.2)
Total Protein: 7.4 g/dL (ref 6.5–8.1)

## 2024-02-04 LAB — DIFFERENTIAL
Abs Immature Granulocytes: 0.02 K/uL (ref 0.00–0.07)
Basophils Absolute: 0.1 K/uL (ref 0.0–0.1)
Basophils Relative: 1 %
Eosinophils Absolute: 0.1 K/uL (ref 0.0–0.5)
Eosinophils Relative: 1 %
Immature Granulocytes: 0 %
Lymphocytes Relative: 31 %
Lymphs Abs: 2.3 K/uL (ref 0.7–4.0)
Monocytes Absolute: 0.4 K/uL (ref 0.1–1.0)
Monocytes Relative: 6 %
Neutro Abs: 4.5 K/uL (ref 1.7–7.7)
Neutrophils Relative %: 61 %

## 2024-02-04 LAB — CBC
HCT: 44.2 % (ref 36.0–46.0)
Hemoglobin: 15 g/dL (ref 12.0–15.0)
MCH: 29.6 pg (ref 26.0–34.0)
MCHC: 33.9 g/dL (ref 30.0–36.0)
MCV: 87.2 fL (ref 80.0–100.0)
Platelets: 308 K/uL (ref 150–400)
RBC: 5.07 MIL/uL (ref 3.87–5.11)
RDW: 12 % (ref 11.5–15.5)
WBC: 7.5 K/uL (ref 4.0–10.5)
nRBC: 0 % (ref 0.0–0.2)

## 2024-02-04 LAB — ETHANOL: Alcohol, Ethyl (B): <15 mg/dL (ref <15)

## 2024-02-04 LAB — PROTIME-INR
INR: 1 (ref 0.8–1.2)
Prothrombin Time: 13.3 s (ref 11.4–15.2)

## 2024-02-04 LAB — APTT: aPTT: 26 s (ref 24–36)

## 2024-02-04 LAB — CBG MONITORING, ED: Glucose-Capillary: 192 mg/dL — ABNORMAL HIGH (ref 70–99)

## 2024-02-04 MED ORDER — LEVOTHYROXINE SODIUM 25 MCG PO TABS
125.0000 ug | ORAL_TABLET | Freq: Every day | ORAL | Status: DC
  Administered 2024-02-05: 125 ug via ORAL
  Filled 2024-02-04: qty 1

## 2024-02-04 MED ORDER — LEVOTHYROXINE SODIUM 125 MCG PO CAPS: 125.0000 ug | ORAL_CAPSULE | Freq: Every day | ORAL | Status: DC

## 2024-02-04 MED ORDER — SENNOSIDES-DOCUSATE SODIUM 8.6-50 MG PO TABS: 1.0000 | ORAL_TABLET | Freq: Every evening | ORAL | Status: DC | PRN

## 2024-02-04 MED ORDER — ACETAMINOPHEN 325 MG PO TABS: 650.0000 mg | ORAL_TABLET | ORAL | Status: DC | PRN

## 2024-02-04 MED ORDER — CLOPIDOGREL BISULFATE 75 MG PO TABS
300.0000 mg | ORAL_TABLET | Freq: Once | ORAL | Status: DC
  Filled 2024-02-04: qty 4

## 2024-02-04 MED ORDER — ROSUVASTATIN CALCIUM 5 MG PO TABS: 10.0000 mg | ORAL_TABLET | Freq: Every day | ORAL | Status: DC

## 2024-02-04 MED ORDER — ASPIRIN 81 MG PO TBEC
81.0000 mg | DELAYED_RELEASE_TABLET | Freq: Every day | ORAL | Status: DC
  Administered 2024-02-04 – 2024-02-05 (×2): 81 mg via ORAL
  Filled 2024-02-04 (×2): qty 1

## 2024-02-04 MED ORDER — ROSUVASTATIN CALCIUM 5 MG PO TABS
5.0000 mg | ORAL_TABLET | Freq: Every day | ORAL | Status: DC
  Filled 2024-02-04 (×2): qty 1

## 2024-02-04 MED ORDER — ENOXAPARIN SODIUM 40 MG/0.4ML IJ SOSY: 40.0000 mg | PREFILLED_SYRINGE | INTRAMUSCULAR | Status: DC

## 2024-02-04 MED ORDER — CLOPIDOGREL BISULFATE 75 MG PO TABS
75.0000 mg | ORAL_TABLET | Freq: Every day | ORAL | Status: DC
  Administered 2024-02-05: 75 mg via ORAL
  Filled 2024-02-04: qty 1

## 2024-02-04 MED ORDER — STROKE: EARLY STAGES OF RECOVERY BOOK
Freq: Once | Status: AC
  Filled 2024-02-04: qty 1

## 2024-02-04 MED ORDER — SODIUM CHLORIDE 0.9 % IV SOLN: INTRAVENOUS | Status: DC

## 2024-02-04 MED ORDER — ACETAMINOPHEN 160 MG/5ML PO SOLN: 650.0000 mg | ORAL | Status: DC | PRN

## 2024-02-04 MED ORDER — ATORVASTATIN CALCIUM 10 MG PO TABS: 20.0000 mg | ORAL_TABLET | Freq: Every day | ORAL | Status: DC

## 2024-02-04 MED ORDER — LORAZEPAM 0.5 MG PO TABS
0.5000 mg | ORAL_TABLET | Freq: Once | ORAL | Status: AC
  Administered 2024-02-04: 0.5 mg via ORAL
  Filled 2024-02-04: qty 1

## 2024-02-04 MED ORDER — HYDRALAZINE HCL 20 MG/ML IJ SOLN
10.0000 mg | Freq: Four times a day (QID) | INTRAMUSCULAR | Status: DC | PRN
  Filled 2024-02-04: qty 1

## 2024-02-04 MED ORDER — IOHEXOL 350 MG/ML SOLN
75.0000 mL | Freq: Once | INTRAVENOUS | Status: AC | PRN
  Administered 2024-02-04: 75 mL via INTRAVENOUS

## 2024-02-04 MED ORDER — ACETAMINOPHEN 650 MG RE SUPP: 650.0000 mg | RECTAL | Status: DC | PRN

## 2024-02-04 NOTE — Progress Notes (Signed)
 Plan of Care Note for accepted transfer   Patient: [name] [name] MRN: [id number]   DOA: 02/04/2024  Facility requesting transfer: [facility] Requesting Provider: Dr Elnor Savant Reason for transfer: TIA/stroke work up Facility course:  73 year old female with past medical history of ascending aortic aneurysm, HTN, HLD, presents to med [facility] with transient aphasia, word finding difficulty with right upper extremity paresthesia.  Current vital signs showed HR 106, RR 16, BP 167/98, uncontrolled, saturating well on room air.  Labs unremarkable.  CT head/CTA head neck unremarkable. Currently all her symptoms have completely resolved.  Patient was seen by teleneurology recommended admission for inpatient workup for TIA/stroke rule out.  Denies any other new complaints.  Please notify neurology for further recommendations once patient arrives [name] [name].  Plan of care: The patient is accepted for admission to Telemetry unit, at [facility]    Author: Lebron JINNY Cage, MD 02/04/2024  Check [email or web address] for on-call coverage.  Nursing staff, Please call [facility] Admits & Consults System-Wide number on Amion as soon as patient's arrival, so appropriate admitting provider can evaluate the pt.

## 2024-02-04 NOTE — ED Provider Notes (Signed)
  Provider Note MRN:  [id number]  Arrival date & time: 02/04/24    ED Course and Medical Decision Making  Assumed care from Dr Patsey  at shift change.  See note from prior team for complete details, in brief:  Clinical Course as of 02/04/24 1522  Fri Feb 04, 2024  1506 Handoff NP 2000 yestd LKN Word finding diff, resolved this am but now having RUE parethesia Aphasia worsened somewhat around 1300 NIHSS now is 0 Teleneuro eval Likely admit for stroke w/u [SG]    Clinical Course User Index [SG] Elnor Jayson LABOR, DO   History of ascending aorta aneurysm, HLD, elevated blood pressure, appendectomy here with transient aphasia, right upper extremity paresthesia; symptoms have since resolved  Spoke with teleneurology, recommended admission for stroke workup, echo, MRI, DAPT. TIA seems likely at this point  Admit [facility]   .Critical Care  Performed by: Elnor Jayson LABOR, DO Authorized by: Elnor Jayson LABOR, DO   Critical care provider statement:    Critical care time (minutes):  30   Critical care time was exclusive of:  Separately billable procedures and treating other patients   Critical care was necessary to treat or prevent imminent or life-threatening deterioration of the following conditions:  CNS failure or compromise   Critical care was time spent personally by me on the following activities:  Development of treatment plan with patient or surrogate, discussions with consultants, evaluation of patient's response to treatment, examination of patient, ordering and review of laboratory studies, ordering and review of radiographic studies, ordering and performing treatments and interventions, pulse oximetry, re-evaluation of patient's condition, review of old charts and obtaining history from patient or surrogate   Care discussed with: admitting provider     Final Clinical Impressions(s) / ED Diagnoses     ICD-10-CM   1. Aphasia  R47.01     2. Paresthesia of arm  R20.2     3.  Elevated blood pressure reading  R03.0       ED Discharge Orders     None       Discharge Instructions   None        Elnor Jayson LABOR, DO 02/04/24 1522

## 2024-02-04 NOTE — Progress Notes (Signed)
 Patient refused her meds. Provider was made aware. Patient was educated, will continue to monitor.

## 2024-02-04 NOTE — ED Notes (Signed)
 17:05 - Room was showing Bed Ready, Called [facility] for transport.  When Nurse went to call report it changed to Assign.  Called Bed Placement. EVS is going to zap room and it should be ready in 30 min.   [facility] was not canceled since room will be cleaned by the time [facility] gets here and transport.

## 2024-02-04 NOTE — ED Notes (Signed)
 Pt to CT scan with this RN to monitor.

## 2024-02-04 NOTE — ED Notes (Signed)
 Pt. Recently at at 1330 today.  Pt. Having no issues speaking and is in no trouble with passing stroke scale.  Pt. Is able to move all limbs and speak clearly.  Neurologist is speaking with Pt. About oral meds and MRI.  Pt. Poss. Had a clot and has sense passed thru. Pt. Will go for MRI and over night stay.  Pt. Will be watched and placed on blood thinners due to this event at the present time.

## 2024-02-04 NOTE — H&P (Addendum)
 History and Physical    [name] [name] FMW:[id number] DOB: [birth year] DOA: 02/04/2024  PCP: Arloa Elsie SAUNDERS, MD   Patient coming from: Home   Chief Complaint:  Chief Complaint  Patient presents with   Code Stroke         HPI:  [name] [name] is a 73 y.o. female with medical history significant of ascending aortic aneurysm, essential hypertension, hyperlipidemia and acquired hypothyroidism who presented to emergency department with complaining of word finding difficulty and right upper extremity paresthesia that started around 8 PM 7/17, initially symptom has been resolved again came back around 1 PM which prompted her come to the ED for evaluation. During my evaluation at the bedside patient reported that all the symptom has been resolved again in the afternoon around 3 PM and currently patient does not have any complaint.  Patient denies any headache, blurry vision, tingling, numbness, chest pain, palpitation, generalized and focal weakness.  Denies any presyncope and syncopal episode.    ED Course:  At presentation to ED patient found hypertensive elevated blood pressure 182/92 and tachycardic heart rate 103 blood pressure has been improved eventually. CT head no acute intercranial abnormality.Mild chronic small vessel disease in the periventricular white matter.  CT angio head and neck no large vessel occlusion or hemodynamic significant stenosis or aneurysm.  Lab work, normal blood glucose level.  UDS negative.  Blood alcohol level within normal range.  Normal pro time INR.  CBC unremarkable.  CMP unremarkable except slightly.  Blood glucose 208.   ED patient has been evaluated by neurology Dr. Delmus recommended admit patient for TIA workup/rule out a stroke.SABRA  Neurology recommendation is following  Recommend admission with stroke work up including MRI brain, 2D echo.  Recommend dual antiplatelet therapy.  Recommend risk stratification labs with lipid profile, HbA1c. Recommend  strict BP and sugar control. Recommend statin.  Needs inpatient Neurology follow up for further recommendations after these tests are done.  Recommendations:         Stroke/Telemetry Floor       Neuro Checks (Q2)       Bedside Swallow Eval       DVT Prophylaxis       IV Fluids, Normal Saline       Head of Bed 30 Degrees       Euglycemia and Avoid Hyperthermia (PRN Acetaminophen)       Bolus with Clopidogrel 300 mg bolus x1 and initiate dual antiplatelet therapy with Aspirin 81 mg daily and Clopidogrel 75 mg daily.   Eventually patient has been transferred to [facility] for further evaluation workup for TIA/rule out stroke.     Significant labs in the ED: Lab Orders         Ethanol         Protime-INR         APTT         CBC         Differential         Comprehensive metabolic panel         Rapid urine drug screen (hospital performed)         Lipid panel         Hemoglobin A1c         CBG monitoring, ED       Review of Systems:  Review of Systems  Constitutional:  Negative for chills, fever, malaise/fatigue and weight loss.  Eyes:  Negative for blurred vision and double vision.  Respiratory:  Negative for cough.   Cardiovascular:  Negative for chest pain, orthopnea and leg swelling.  Gastrointestinal:  Negative for abdominal pain, heartburn and nausea.  Neurological:  Negative for dizziness, tingling, tremors, sensory change, speech change, focal weakness, seizures, loss of consciousness, weakness and headaches.  Psychiatric/Behavioral:  The patient is not nervous/anxious and does not have insomnia.     History reviewed. No pertinent past medical history.  Past Surgical History:  Procedure Laterality Date   APPENDECTOMY     EYE SURGERY     THYROID  SURGERY     TUBAL LIGATION       reports that she has never smoked. She has never used smokeless tobacco. She reports that she does not currently use alcohol. She reports that she does not use  drugs.  Allergies  Allergen Reactions   Azithromycin Nausea Only   Erythromycin     Other reaction(s): GI upset   Rosuvastatin  Calcium      Other reaction(s): myalgia   Penicillin G     Other reaction(s): childhood, Other (See Comments) fainting     Family History  Problem Relation Age of Onset   Cancer Mother    Heart attack Father    Heart attack Brother     Prior to Admission medications   Medication Sig Start Date End Date Taking? Authorizing Provider  atorvastatin  (LIPITOR) 20 MG tablet Take 1 tablet (20 mg total) by mouth daily. 03/31/23 06/29/23  Patwardhan, Newman PARAS, MD  azithromycin (ZITHROMAX) 250 MG tablet Take 250 mg by mouth as directed. 03/30/23   [provider]  Calcium  Carb-Cholecalciferol 600-5 MG-MCG TABS 2 capsules    [provider]  Garlic 1000 MG CAPS Take by mouth.    [provider]  Levothyroxine Sodium 125 MCG CAPS Take 125 mcg by mouth daily before breakfast. 04/13/16   [provider]  Multiple Vitamin (MULTI-VITAMIN) tablet Take 1 tablet by mouth daily.    [provider]  NON FORMULARY Provasil (memory boost)    [provider]  Omega 3 1000 MG CAPS 1 capsule with a meal    [provider]  Red Yeast Rice Extract ([employer] RED YEAST RICE) 600 MG CAPS See admin instructions.    [provider]  Saccharomyces boulardii (PROBIOTIC) 250 MG CAPS 1 capsule    [provider]     Physical Exam: Vitals:   02/04/24 1433 02/04/24 1545 02/04/24 1700  BP: (!) 182/92 (!) 167/98 136/81  Pulse: (!) 103 (!) 106 (!) 102  Resp: 18 16 10   SpO2: 99% 97% 97%    Physical Exam Vitals and nursing note reviewed.  Constitutional:      Appearance: She is not ill-appearing.  HENT:     Mouth/Throat:     Mouth: Mucous membranes are moist.  Cardiovascular:     Rate and Rhythm: Regular rhythm. Tachycardia present.     Pulses: Normal pulses.     Heart sounds: Normal heart sounds.  Pulmonary:      Effort: Pulmonary effort is normal.  Abdominal:     General: Bowel sounds are normal.  Musculoskeletal:     Cervical back: Neck supple.     Right lower leg: No edema.     Left lower leg: No edema.  Skin:    Capillary Refill: Capillary refill takes less than 2 seconds.  Neurological:     Mental Status: She is alert and oriented to person, place, and time.     Cranial Nerves: No cranial  nerve deficit.     Sensory: No sensory deficit.     Motor: No weakness.     Coordination: Coordination normal.     Gait: Gait normal.     Deep Tendon Reflexes: Reflexes normal.  Psychiatric:        Mood and Affect: Mood normal.        Thought Content: Thought content normal.        Judgment: Judgment normal.      Labs on Admission: I have personally reviewed following labs and imaging studies  CBC: Recent Labs  Lab 02/04/24 1448  WBC 7.5  NEUTROABS 4.5  HGB 15.0  HCT 44.2  MCV 87.2  PLT 308   Basic Metabolic Panel: Recent Labs  Lab 02/04/24 1448  NA 137  K 3.5  CL 99  CO2 27  GLUCOSE 208*  BUN 15  CREATININE 0.71  CALCIUM  9.3   GFR: CrCl cannot be calculated (Unknown ideal weight.). Liver Function Tests: Recent Labs  Lab 02/04/24 1448  AST 24  ALT 16  ALKPHOS 85  BILITOT 0.4  PROT 7.4  ALBUMIN 4.4   No results for input(s): LIPASE, AMYLASE in the last 168 hours. No results for input(s): AMMONIA in the last 168 hours. Coagulation Profile: Recent Labs  Lab 02/04/24 1448  INR 1.0   Cardiac Enzymes: No results for input(s): CKTOTAL, CKMB, CKMBINDEX, TROPONINI, TROPONINIHS in the last 168 hours. BNP (last 3 results) No results for input(s): BNP in the last 8760 hours. HbA1C: No results for input(s): HGBA1C in the last 72 hours. CBG: Recent Labs  Lab 02/04/24 1430  GLUCAP 192*   Lipid Profile: No results for input(s): CHOL, HDL, LDLCALC, TRIG, CHOLHDL, LDLDIRECT in the last 72 hours. Thyroid  Function Tests: No results  for input(s): TSH, T4TOTAL, FREET4, T3FREE, THYROIDAB in the last 72 hours. Anemia Panel: No results for input(s): VITAMINB12, FOLATE, FERRITIN, TIBC, IRON, RETICCTPCT in the last 72 hours. Urine analysis: No results found for: COLORURINE, APPEARANCEUR, LABSPEC, PHURINE, GLUCOSEU, HGBUR, BILIRUBINUR, KETONESUR, PROTEINUR, UROBILINOGEN, NITRITE, LEUKOCYTESUR  Radiological Exams on Admission: I have personally reviewed images EXAM: CT HEAD WITHOUT CONTRAST 02/04/2024 02:42:32 PM   TECHNIQUE: CT of the head was performed without the administration of intravenous contrast. Automated exposure control, iterative reconstruction, and/or weight based adjustment of the mA/kV was utilized to reduce the radiation dose to as low as reasonably achievable.   COMPARISON: None available.   CLINICAL HISTORY: Neuro deficit, acute, stroke suspected.   FINDINGS:   BRAIN AND VENTRICLES: No acute hemorrhage. Gray-white differentiation is preserved. No hydrocephalus. No extra-axial collection. No mass effect or midline shift. Mild patchy hypoattenuation of the periventricular white matter, most consistent with mild chronic small vessel disease.   [place] stroke program early CT (ASPECT) score ----- Ganglionic (caudate, IC, Lentiform Nucleus, insula, M1-M3): 7 Supraganglionic (M4-M6): 3   Total: 10   ORBITS: No acute abnormality.   SINUSES: No acute abnormality.   SOFT TISSUES AND SKULL: No acute soft tissue abnormality. No skull fracture.   IMPRESSION: 1. No acute intracranial abnormality. ASPECT score: 10. 2. Mild chronic small vessel disease in the periventricular white matter.   The professional radiologist assistant staff have been asked to put me in contact with the referring provider for communication of results. An addendum will be submitted to document this communication.   Electronically signed by: Ryan Chess MD 02/04/2024  02:50 PM EDT RP Workstation: [id number] EXAM: CTA HEAD AND NECK WITH AND WITHOUT 02/04/2024 02:52:00 PM   TECHNIQUE: CTA  of the head and neck was performed with and without the administration of intravenous contrast. Multiplanar 2D and/or 3D reformatted images are provided for review. Automated exposure control, iterative reconstruction, and/or weight based adjustment of the mA/kV was utilized to reduce the radiation dose to as low as reasonably achievable. Stenosis of the internal carotid arteries measured using NASCET criteria.   COMPARISON: CT head 02/04/2024   CLINICAL HISTORY: Neuro deficit, acute, stroke suspected. Pt presents with expressive aphasia that started at 13:00 today. Pt reports that she started having some difficulty getting her words out last night, but then the symptoms resolved and she was fine today. Then she started having expressive aphasia again at 13:00 today. She does report she had some associated HA and numbness on her R side. Code Stroke was called and EDP at bedside. After assessing pt EDP adjusting LKW to 20:00 last PM.   FINDINGS:   CTA NECK:   AORTIC ARCH AND ARCH VESSELS: Two-vessel arch configuration with common origin of the left common carotid and right brachiocephalic arteries. No dissection or arterial injury. No significant stenosis of the brachiocephalic or subclavian arteries.   CERVICAL CAROTID ARTERIES: No dissection, arterial injury, or hemodynamically significant stenosis by NASCET criteria.   CERVICAL VERTEBRAL ARTERIES: No dissection, arterial injury, or significant stenosis.   LUNGS AND MEDIASTINUM: Unremarkable.   SOFT TISSUES: No acute abnormality.   BONES: No acute abnormality.   CTA HEAD:   ANTERIOR CIRCULATION: No significant stenosis of the internal carotid arteries. No significant stenosis of the anterior cerebral arteries. No significant stenosis of the middle cerebral arteries. No aneurysm.    POSTERIOR CIRCULATION: No significant stenosis of the posterior cerebral arteries. No significant stenosis of the basilar artery. No significant stenosis of the vertebral arteries. No aneurysm.   OTHER: Azygous A2 segment of the ACAs. No dural venous sinus thrombosis on this non-dedicated study.   IMPRESSION: 1. No large vessel occlusion, hemodynamically significant stenosis, or aneurysm in the head or neck.   Code stroke results were communicated to Dr. Patsey at 2:56 PM on 02/04/2024 by phone.   Electronically signed by: Ryan Chess MD 02/04/2024 03:02 PM EDT RP Workstation: [id number]      EKG: My personal interpretation of EKG shows: Sinus tachycardia heart rate 103.    Assessment/Plan: Principal Problem:   TIA (transient ischemic attack) Active Problems:   Hyperlipidemia   Essential hypertension   History of aortic aneurysm   Acquired hypothyroidism    Assessment and Plan: Transient ischemic attack -Patient initially presented emergency department complaining of word finding difficulty and left-sided upper extremity numbness 2 episodes at 8 PM last night and second episode around 1 PM today which has been resolved at 3 PM.  Currently patient does not have any complaint. -At presentation to ED patient found to be hypertensive however blood pressure has been improved. -Will do blood work CBC, CMP, PT/INR, UDS, blood alcohol level within normal range. - EKG showing sinus tachycardia heart rate 103 otherwise unremarkable -CT head no acute intercranial abnormality.Mild chronic small vessel disease in the periventricular white matter.  CT angio head and neck no large vessel occlusion or hemodynamic significant stenosis or aneurysm. -In the ED patient has been evaluated by teleneurology recommended MRI of the brain, echocardiogram.  Also initiate dual antiplatelet therapy Bolus with Clopidogrel 300 mg bolus x1 and initiate dual antiplatelet therapy with Aspirin 81 mg  daily and Clopidogrel 75 mg daily  -Obtaining MRI of the brain - Obtain echocardiogram - Giving Plavix 300  mg bolus tonight, initiating dual antiplatelet therapy aspirin 81 mg tonight and Plavix 75 mg from tomorrow as well. -Continue neurocheck every 4 hours - Obtain stroke swallow screen after that resume oral diet - Patient reported she is not taking Lipitor anymore because it made her feel unusually funny/jittery and insomnia.  Patient agrees to try alternative  low-dose Crestor . - Checking A1c and lipid panel. - Continue permissive hypertension for next 24 hours goal blood pressure below 210/120. -Continue cardiac monitoring. -Consulted PT/OT. Addendum -MRI brain no acute intracranial pathology.  Chronic small vessel disease. - Patient refusing Plavix, Lipitor.  Only agrees to take with aspirin.  Unable to give the Plavix load tonight.   History of essential hypertension -Initial presentation to ED patient found hypertensive blood pressure has been improved without any intervention.  Per chart review at home patient is not any antihypertensive regimen.  Concern for initial symptoms of TIA possibly from underlying elevated blood pressure/uncontrolled hypertension. -Continue monitor blood pressure closely.  At this time allowing permissive hypertension for next 24 hours.  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  Resume home medication.  History of aortic aneurysm - Per chart review previous CT MRI of the aorta from 03/09/2023 showedAortic atherosclerosis with mild aneurysmal dilatation of the ascending aorta measuring 4.0 cm. Recommend annual imaging followup. -Patient need annual outpatient follow-up.   DVT prophylaxis:  Lovenox Code Status:  Full Code Diet: Heart healthy diet Family Communication:   Family was present at  bedside, at the time of interview. Opportunity was given to ask question and all questions were answered satisfactorily.  Disposition Plan: Need to follow-up with MRI and echocardiogram result. Consults: Neurology Admission status:   Observation, Telemetry bed  Severity of Illness: The appropriate patient status for this patient is OBSERVATION. Observation status is judged to be reasonable and necessary in order to provide the required intensity of service to ensure the patient's safety. The patient's presenting symptoms, physical exam findings, and initial radiographic and laboratory data in the context of their medical condition is felt to place them at decreased risk for further clinical deterioration. Furthermore, it is anticipated that the patient will be medically stable for discharge from the hospital within 2 midnights of admission.     Jariyah Hackley, MD [facility]  How to contact the [facility] Attending or Consulting provider 7A - 7P or covering provider during after hours 7P -7A, for this patient.  Check the care team in [facility] and look for a) attending/consulting [facility] provider listed and b) the [facility] team listed Log into [email or web address] and use [facility]'s universal password to access. If you do not have the password, please contact the hospital operator. Locate the [facility] provider you are looking for under [facility] and page to a number that you can be directly reached. If you still have difficulty reaching the provider, please page the [facility] (Director on Call) for the Hospitalists listed on amion for assistance.  02/04/2024, 7:46 PM

## 2024-02-04 NOTE — ED Notes (Signed)
 ED Provider at bedside.

## 2024-02-04 NOTE — ED Notes (Signed)
 Patient transported to CT with RN, tele neuro in progress

## 2024-02-04 NOTE — Progress Notes (Signed)
 Patient off the unit. When to MRI.

## 2024-02-04 NOTE — ED Notes (Signed)
 [facility] called to advised of possible Code Stroke - In CT now.

## 2024-02-04 NOTE — ED Notes (Signed)
 Pt presents with expressive aphasia that started at 13:00 today. Pt reports that she started having some difficulty getting her words out last night, but then the symptoms resolved and she was fine today. Then she started having expressive aphasia again at 13:00 today. She does report she had some associated HA and numbness on her R side. Code Stroke was called and EDP at bedside. After assessing pt EDP adjusting LKW to 20:00 last PM.

## 2024-02-04 NOTE — Plan of Care (Signed)

## 2024-02-04 NOTE — ED Notes (Signed)
 [facility] called at 17:05 for transport.  Spoke with [employer].

## 2024-02-04 NOTE — Consult Note (Signed)
 [facility] Tele[facility]rology Consult Services   [name] [name] Name:   [name] [name] Date of Birth:   May 19, 1951 Identification Number:   MRN - [id number] Date of Service:   02/04/2024 14:40:57  Diagnosis:       G45.9 - Transient cerebral ischemic attack, unspecified  Impression:      The [name] [name] is either experiencing a TIA (her symptoms now resolved) or a minor stroke with stuttering symptoms. She is currently completely asymptomatic, so she would not benefit from TNK. CTAs also excluded a LVO.  Recommend admission with stroke work up including MRI brain, 2D echo.  Recommend dual antiplatelet therapy.  Recommend risk stratification labs with lipid profile, HbA1c. Recommend strict BP and sugar control. Recommend statin.  Needs inpatient Neurology follow up for further recommendations after these tests are done.  Our recommendations are outlined below.  Recommendations:        Stroke/Telemetry Floor       Neuro Checks (Q2)       Bedside Swallow Eval       DVT Prophylaxis       IV Fluids, Normal Saline       Head of Bed 30 Degrees       Euglycemia and Avoid Hyperthermia (PRN Acetaminophen)       Bolus with Clopidogrel 300 mg bolus x1 and initiate dual antiplatelet therapy with Aspirin 81 mg daily and Clopidogrel 75 mg daily  Sign Out:       Discussed with Emergency Department Provider    ------------------------------------------------------------------------------  Advanced Imaging: CTA Head and Neck Completed.  LVO:No  [name] [name] is not a candidate for NIR   Metrics: Last Known Well: 02/03/2024 20:00:00 Dispatch Time: 02/04/2024 14:40:57 Arrival Time: 02/04/2024 14:21:00 Initial Response Time: 02/04/2024 14:43:18 Symptoms: Difficulty speaking. Initial [name] [name] interaction: 02/04/2024 14:50:28 NIHSS Assessment Completed: 02/04/2024 14:58:37 [name] [name] is not a candidate for Thrombolytic. Thrombolytic Medical Decision: 02/04/2024 14:58:38 [name] [name] was not deemed  candidate for Thrombolytic because of following reasons: Resolved symptoms .  CT Head: CT head unremarkable for acute infarction or hemorrhage per Radiology: no acute pathology, only small vessel disease seen.  Primary Provider Notified of Diagnostic Impression and Management Plan on: 02/04/2024 15:16:07    ------------------------------------------------------------------------------  History of Present Illness: [name] [name] is a 73 year old Female.  [name] [name] was brought by private transportation with symptoms of Difficulty speaking. 73 yo female with high BP only when seeing providers, prediabetes and DLP who comes in with symptoms that started last night at around 8pm, with difficulty getting words out and numbness of the right hand. This seemed to have mostly resolved this morning but came back at 1pm, which prompted her visit to the ER. At the time of my evaluation, her symptoms had completely resolved, and she has currently no complaints.    Past Medical History:      Hypertension      Hyperlipidemia      There is no history of Stroke Other PMH:  Thyroid  disease  Prediabetes  white coat syndrome  Medications:  No Anticoagulant use  No Antiplatelet use Reviewed EMR for current medications  Allergies:  Reviewed  Social History: Smoking: No Alcohol Use: No Drug Use: No  Family History:  There is no family history of premature cerebrovascular disease pertinent to this consultation  ROS : 14 Points Review of Systems was performed and was negative except mentioned in HPI.  Past Surgical History: There Is No Surgical History Contributory To Today's Visit     Examination: BP(182/92), Pulse(103), Blood Glucose(192)  1A: Level of Consciousness - Alert; keenly responsive + 0 1B: Ask Month and Age - Both Questions Right + 0 1C: Blink Eyes & Squeeze Hands - Performs Both Tasks + 0 2: Test Horizontal Extraocular Movements - Normal + 0 3: Test Visual Fields - No Visual  Loss + 0 4: Test Facial Palsy (Use Grimace if Obtunded) - Normal symmetry + 0 5A: Test Left Arm Motor Drift - No Drift for 10 Seconds + 0 5B: Test Right Arm Motor Drift - No Drift for 10 Seconds + 0 6A: Test Left Leg Motor Drift - No Drift for 5 Seconds + 0 6B: Test Right Leg Motor Drift - No Drift for 5 Seconds + 0 7: Test Limb Ataxia (FNF/Heel-Shin) - No Ataxia + 0 8: Test Sensation - Normal; No sensory loss + 0 9: Test Language/Aphasia - Normal; No aphasia + 0 10: Test Dysarthria - Normal + 0 11: Test Extinction/Inattention - No abnormality + 0  NIHSS Score: 0   Pre-Morbid Modified Rankin Scale: 0 Points = No symptoms at all  Spoke with : Dr Elnor I reviewed the available imaging via Rapid and initiated discussion with the primary provider  This consult was conducted in real time using interactive audio and video technology. [name] [name] was informed of the technology being used for this visit and agreed to proceed. [name] [name] located in hospital and provider located at home/office setting.   [name] [name] is being evaluated for possible acute neurologic impairment and high probability of imminent or life-threatening deterioration. I spent total of 38 minutes providing care to this [name] [name], including time for face to face visit via telemedicine, review of medical records, imaging studies and discussion of findings with providers, the [name] [name] and/or family.   Dr Cynthia Newness   [facility] For Inpatient follow-up with [facility] physician please call [facility] at [phone]. As we are not an outpatient service for any post hospital discharge needs please contact the hospital for assistance. If you have any questions for the [facility] physicians or need to reconsult for clinical or diagnostic changes please contact us  via [facility] at [phone].

## 2024-02-04 NOTE — ED Notes (Signed)
 Neuro explaining to Pt. She poss. Had a stroke.

## 2024-02-04 NOTE — ED Provider Notes (Signed)
 [place] [facility] AT [facility] Provider Note   CSN: [id number] Arrival date & time: 02/04/24  1421     Patient presents with: Code Stroke (/)   [name] [name] is a 73 y.o. female.   HPI Patient presents difficulty speaking.  Reportedly developed a last night at around 8:00.  States she woke up this morning with that resolved but did have tingling or numbness on the right hand.  States that around 1:00 today began to have the difficulty speaking.  Does have a posterior headache.  Does not tend to get headaches.  After discussion with patient and possible LVO with aphasia and lateralizing findings code stroke was called.   No past medical history on file.  Prior to Admission medications   Medication Sig Start Date End Date Taking? Authorizing Provider  atorvastatin  (LIPITOR) 20 MG tablet Take 1 tablet (20 mg total) by mouth daily. 03/31/23 06/29/23  Patwardhan, Newman PARAS, MD  azithromycin (ZITHROMAX) 250 MG tablet Take 250 mg by mouth as directed. 03/30/23   [provider]  Calcium  Carb-Cholecalciferol 600-5 MG-MCG TABS 2 capsules    [provider]  Garlic 1000 MG CAPS Take by mouth.    [provider]  Levothyroxine Sodium 125 MCG CAPS Take 125 mcg by mouth daily before breakfast. 04/13/16   [provider]  Multiple Vitamin (MULTI-VITAMIN) tablet Take 1 tablet by mouth daily.    [provider]  NON FORMULARY Provasil (memory boost)    [provider]  Omega 3 1000 MG CAPS 1 capsule with a meal    [provider]  Red Yeast Rice Extract ([employer] RED YEAST RICE) 600 MG CAPS See admin instructions.    [provider]  Saccharomyces boulardii (PROBIOTIC) 250 MG CAPS 1 capsule    [provider]    Allergies: Azithromycin, Erythromycin, Rosuvastatin  calcium , and Penicillin g    Review of Systems  Updated Vital Signs BP (!) 182/92   Pulse (!) 103   SpO2 99%   Physical  Exam Vitals and nursing note reviewed.  Cardiovascular:     Rate and Rhythm: Normal rate.  Neurological:     Mental Status: She is alert.     Comments: For us  symmetric.  Eye movements intact.  Visual fields intact.  No neglect.  Possible paresthesias in the right hand.  Occasional word finding.  Good grip strength bilaterally.  Good straight leg raise bilaterally.     (all labs ordered are listed, but only abnormal results are displayed) Labs Reviewed  CBG MONITORING, ED - Abnormal; Notable for the following components:      Result Value   Glucose-Capillary 192 (*)    All other components within normal limits  ETHANOL  PROTIME-INR  APTT  CBC  DIFFERENTIAL  COMPREHENSIVE METABOLIC PANEL WITH GFR  URINE DRUG SCREEN    EKG: None  Radiology: No results found.   Procedures   Medications Ordered in the ED - No data to display                                  Medical Decision Making Amount and/or Complexity of Data Reviewed Labs: ordered. Radiology: ordered.    patient with potential focal neurologic deficits.  Does have headache which she would also add causes such as complicated migraine to differential.  However with lateralizing findings of the right-sided numbness is along with aphasia potentially is  LVO positive.  Last normal would be around 8:00 last night though when the difficulty speaking started.  That had resolved this morning but did have the numbness.  Difficulty speaking began again at around 1 PM.  Code stroke activated.  Discussed with Dr.Tamer from teleneurology.  Not a TNK candidate due to time of onset.  With not debilitating deficits at this time would not be intravascular candidate either.  But still does have time if an LVO does show up on vessel imaging and develops worse deficits.  CRITICAL CARE Performed by: [place] Total critical care time: 30 minutes Critical care time was exclusive of separately billable procedures and treating  other patients. Critical care was necessary to treat or prevent imminent or life-threatening deterioration. Critical care was time spent personally by me on the following activities: development of treatment plan with patient and/or surrogate as well as nursing, discussions with consultants, evaluation of patient's response to treatment, examination of patient, obtaining history from patient or surrogate, ordering and performing treatments and interventions, ordering and review of laboratory studies, ordering and review of radiographic studies, pulse oximetry and re-evaluation of patient's condition.  Care will be turned over to Dr. Elnor        Final diagnoses:  None    ED Discharge Orders     None          River Rankin, MD 02/04/24 [phone]

## 2024-02-04 NOTE — ED Notes (Signed)
 Pt. Is speaking with Tele Neuro at present time.  Neuro is assessing the Pt. Via computer.

## 2024-02-05 ENCOUNTER — Observation Stay (HOSPITAL_BASED_OUTPATIENT_CLINIC_OR_DEPARTMENT_OTHER)

## 2024-02-05 ENCOUNTER — Other Ambulatory Visit: Payer: Self-pay

## 2024-02-05 DIAGNOSIS — G43109 Migraine with aura, not intractable, without status migrainosus: Secondary | ICD-10-CM

## 2024-02-05 DIAGNOSIS — R030 Elevated blood-pressure reading, without diagnosis of hypertension: Secondary | ICD-10-CM | POA: Diagnosis not present

## 2024-02-05 DIAGNOSIS — E785 Hyperlipidemia, unspecified: Secondary | ICD-10-CM

## 2024-02-05 DIAGNOSIS — G459 Transient cerebral ischemic attack, unspecified: Secondary | ICD-10-CM

## 2024-02-05 LAB — ECHOCARDIOGRAM COMPLETE
AR max vel: 2.24 cm2
AV Area VTI: 2.48 cm2
AV Area mean vel: 2.46 cm2
AV Mean grad: 6 mmHg
AV Peak grad: 12.7 mmHg
Ao pk vel: 1.78 m/s
Area-P 1/2: 6.02 cm2
Height: 62 in
S' Lateral: 2.2 cm
Weight: 1816.59 [oz_av]

## 2024-02-05 LAB — CBC
HCT: 42.2 % (ref 36.0–46.0)
Hemoglobin: 14.4 g/dL (ref 12.0–15.0)
MCH: 29.9 pg (ref 26.0–34.0)
MCHC: 34.1 g/dL (ref 30.0–36.0)
MCV: 87.6 fL (ref 80.0–100.0)
Platelets: 291 K/uL (ref 150–400)
RBC: 4.82 MIL/uL (ref 3.87–5.11)
RDW: 11.9 % (ref 11.5–15.5)
WBC: 9.4 K/uL (ref 4.0–10.5)
nRBC: 0 % (ref 0.0–0.2)

## 2024-02-05 LAB — BASIC METABOLIC PANEL WITH GFR
Anion gap: 8 (ref 5–15)
BUN: 11 mg/dL (ref 8–23)
CO2: 26 mmol/L (ref 22–32)
Calcium: 9.3 mg/dL (ref 8.9–10.3)
Chloride: 103 mmol/L (ref 98–111)
Creatinine, Ser: 0.6 mg/dL (ref 0.44–1.00)
GFR, Estimated: >60 mL/min (ref >60)
Glucose, Bld: 111 mg/dL — ABNORMAL HIGH (ref 70–99)
Potassium: 3.7 mmol/L (ref 3.5–5.1)
Sodium: 137 mmol/L (ref 135–145)

## 2024-02-05 LAB — HEMOGLOBIN A1C
Hgb A1c MFr Bld: 6.2 % — ABNORMAL HIGH (ref 4.8–5.6)
Mean Plasma Glucose: 131.24 mg/dL

## 2024-02-05 LAB — LIPID PANEL
Cholesterol: 270 mg/dL — ABNORMAL HIGH (ref 0–200)
HDL: 61 mg/dL (ref >40)
LDL Cholesterol: 184 mg/dL — ABNORMAL HIGH (ref 0–99)
Total CHOL/HDL Ratio: 4.4 ratio
Triglycerides: 126 mg/dL (ref <150)
VLDL: 25 mg/dL (ref 0–40)

## 2024-02-05 MED ORDER — ATORVASTATIN CALCIUM 80 MG PO TABS: 80.0000 mg | ORAL_TABLET | Freq: Every day | ORAL | Status: DC

## 2024-02-05 MED ORDER — ATORVASTATIN CALCIUM 40 MG PO TABS
40.0000 mg | ORAL_TABLET | Freq: Every day | ORAL | Status: DC
  Administered 2024-02-05: 40 mg via ORAL
  Filled 2024-02-05: qty 1

## 2024-02-05 MED ORDER — ASPIRIN 81 MG PO TBEC: 81.0000 mg | DELAYED_RELEASE_TABLET | Freq: Every day | ORAL | 3 refills | Status: AC

## 2024-02-05 MED ORDER — CLOPIDOGREL BISULFATE 75 MG PO TABS: 75.0000 mg | ORAL_TABLET | Freq: Every day | ORAL | 0 refills | Status: AC

## 2024-02-05 MED ORDER — ATORVASTATIN CALCIUM 40 MG PO TABS: 40.0000 mg | ORAL_TABLET | Freq: Every day | ORAL | 3 refills | Status: AC

## 2024-02-05 NOTE — Discharge Instructions (Signed)
 Mrs. [name] [name], It was a pleasure taking care of you at [facility]. You were admitted for stroke-like symptoms and diagnosed a transient ischemic attack (mini stroke). We are discharging you home now that you are doing better. Please follow the following instructions.  1) take aspirin  and Plavix  for 3 weeks then aspirin  alone 2) start atorvastatin  40 mg daily, discontinue red yeast extract supplements since taking this medicine with a statin can increase your risk of muscle pain 3) follow-up with your primary care doctor and neurology in the outpatient  Take care,  Dr. Claretta Alderman, MD, MPH

## 2024-02-05 NOTE — Evaluation (Signed)
 Physical Therapy Brief Evaluation and Discharge Note Patient Details Name: [name] [name] MRN: [id number] DOB: 09/06/1950 Today's Date: 02/05/2024   History of Present Illness  Pt is a 73 yo female presenting to [facility] on 02/04/24 with c/o word finding difficulty and RUE paresthesia. MRI, CT, and CTA negative for acute abnormlities or occlusions. Pt currently ruled to have TIA. PMH of AAA, HTN, HLD.  Clinical Impression  Pt is independent with all mobility and has no equipment needs. Pt's husband can provide 24 hour support upon discharge. Pt and husband educated on BE FAST stroke symptomology. Pt has no further PT needs. PT signing off.        PT Assessment Patient does not need any further PT services  Assistance Needed at Discharge  PRN    Equipment Recommendations None recommended by PT     Precautions/Restrictions Precautions Precautions: None Restrictions Weight Bearing Restrictions Per Provider Order: No        Mobility  Bed Mobility   Supine/Sidelying to sit: Independent Sit to supine/sidelying: Independent    Transfers Overall transfer level: Independent                      Ambulation/Gait Ambulation/Gait assistance: Independent Gait Distance (Feet): 500 Feet Assistive device: None Gait Pattern/deviations: Step-through pattern, WFL(Within Functional Limits) Gait Speed: Pace WFL General Gait Details: strong, steady gait  Home Activity Instructions    Stairs Stairs: Yes Stairs assistance: Independent Stair Management: No rails, Backwards, Forwards Number of Stairs: 1 General stair comments: able to step up and back with strong power up and descent  Modified Rankin (Stroke Patients Only) Modified Rankin (Stroke Patients Only) Pre-Morbid Rankin Score: No symptoms Modified Rankin: No symptoms      Balance Overall balance assessment: No apparent balance deficits (not formally assessed)               Standardized Balance  Assessment Standardized Balance Assessment : Dynamic Gait Index Dynamic Gait Index Level Surface: Normal Change in Gait Speed: Normal Gait with Horizontal Head Turns: Normal Gait with Vertical Head Turns: Normal Gait and Pivot Turn: Normal Step Over Obstacle: Normal Step Around Obstacles: Normal Steps: Normal Total Score: 24      Pertinent Vitals/Pain PT - Brief Vital Signs All Vital Signs Stable: Yes Pain Assessment Pain Assessment: No/denies pain     Home Living Family/patient expects to be discharged to:: Private residence Living Arrangements: Spouse/significant other Available Help at Discharge: Family;Available PRN/intermittently     Home Equipment: Grab bars - tub/shower;Rolling Walker (2 wheels)        Prior Function Level of Independence: Independent      UE/LE Assessment   UE ROM/Strength/Tone/Coordination: WFL    LE ROM/Strength/Tone/Coordination: WFL      Communication   Communication Communication: No apparent difficulties     Cognition Overall Cognitive Status: Appears within functional limits for tasks assessed/performed       General Comments General comments (skin integrity, edema, etc.): VSS on RA, Educated on BE FAST              PT Visit Diagnosis Unsteadiness on feet (R26.81)    No Skilled PT All education completed;Patient at baseline level of functioning;Patient will have necessary level of assist by caregiver at discharge;Patient is independent with all acitivity/mobility    AMPAC 6 Clicks Help needed turning from your back to your side while in a flat bed without using bedrails?: None Help needed moving from lying on your back to sitting  on the side of a flat bed without using bedrails?: None Help needed moving to and from a bed to a chair (including a wheelchair)?: None Help needed standing up from a chair using your arms (e.g., wheelchair or bedside chair)?: None Help needed to walk in hospital room?: None Help needed  climbing 3-5 steps with a railing? : None 6 Click Score: 24      End of Session Equipment Utilized During Treatment: Gait belt Activity Tolerance: Patient tolerated treatment well Patient left: in bed;with call bell/phone within reach;with family/visitor present Nurse Communication: Mobility status PT Visit Diagnosis: Unsteadiness on feet (R26.81)     Time: 8875-8855 PT Time Calculation (min) (ACUTE ONLY): 20 min  Charges:   PT Evaluation $PT Eval Low Complexity: 1 Low      [name] [name] PT, DPT Acute Rehabilitation Services Please use secure chat or  Call Office [phone]   [name] [name] [facility]  02/05/2024, 7:03 PM

## 2024-02-05 NOTE — Progress Notes (Signed)
 OT Screen Note  Patient Details Name: [name] [name] MRN: [id number] DOB: 02-28-1951   Cancelled Treatment:    Reason Eval/Treat Not Completed: OT screened, no needs identified, will sign off (Pt working with PT, ind with no skilled OT needs idenitified. Signing off at this time)  02/05/2024  AB, OTR/L  Acute Rehabilitation Services  Office: [phone]  [name] [name] 02/05/2024, 11:49 AM

## 2024-02-05 NOTE — Progress Notes (Signed)
 Received a message from the pharmacy regarding the Synthroid  medication that this patient is supposed to take: The synthroid  capsules will need to be brought in for dispensing. Just see if there is anyone who can bring it in please. I let the patient know about this. Husband is at the bedside, stated that he will bring the medication from home sometime today. Will pass this message to day shift nurse to follow up.

## 2024-02-05 NOTE — Progress Notes (Signed)
 STROKE TEAM PROGRESS NOTE   SUBJECTIVE (INTERVAL HISTORY) Her husband is at the bedside.  Overall her condition is resolved. She was in her neighbor's house Thursday and had some visual disturbance on the right eye, some wave-like images. She went home and had difficulty speaking and right fingers tingling with back of head pain, lasted about . Friday, it happened again with back of head pain, right eye visual disturbance after lunch, lasted about . Currently she is at baseline. Denies hx of migraine, denies any triggers that she could think of.    OBJECTIVE Temp:  [97.4 F (36.3 C)-98.2 F (36.8 C)] 98.2 F (36.8 C) (07/19 1232) Pulse Rate:  [79-106] 99 (07/19 1232) Cardiac Rhythm: Normal sinus rhythm (07/19 0700) Resp:  [10-18] 18 (07/19 1232) BP: (118-182)/(81-107) 118/86 (07/19 1232) SpO2:  [96 %-99 %] 97 % (07/19 1232) Weight:  [51.5 kg] 51.5 kg (07/18 2009)  Recent Labs  Lab 02/04/24 1430  GLUCAP 192*   Recent Labs  Lab 02/04/24 1448 02/05/24 0303  NA 137 137  K 3.5 3.7  CL 99 103  CO2 27 26  GLUCOSE 208* 111*  BUN 15 11  CREATININE 0.71 0.60  CALCIUM  9.3 9.3   Recent Labs  Lab 02/04/24 1448  AST 24  ALT 16  ALKPHOS 85  BILITOT 0.4  PROT 7.4  ALBUMIN 4.4   Recent Labs  Lab 02/04/24 1448 02/05/24 0303  WBC 7.5 9.4  NEUTROABS 4.5  --   HGB 15.0 14.4  HCT 44.2 42.2  MCV 87.2 87.6  PLT 308 291   No results for input(s): CKTOTAL, CKMB, CKMBINDEX, TROPONINI in the last 168 hours. Recent Labs    02/04/24 1448  LABPROT 13.3  INR 1.0   No results for input(s): COLORURINE, LABSPEC, PHURINE, GLUCOSEU, HGBUR, BILIRUBINUR, KETONESUR, PROTEINUR, UROBILINOGEN, NITRITE, LEUKOCYTESUR in the last 72 hours.  Invalid input(s): APPERANCEUR     Component Value Date/Time   CHOL 270 (H) 02/05/2024 0303   CHOL 255 (H) 03/29/2023 0854   TRIG 126 02/05/2024 0303   HDL 61 02/05/2024 0303   HDL 63 03/29/2023 0854   CHOLHDL  4.4 02/05/2024 0303   VLDL 25 02/05/2024 0303   LDLCALC 184 (H) 02/05/2024 0303   LDLCALC 163 (H) 03/29/2023 0854   Lab Results  Component Value Date   HGBA1C 6.2 (H) 02/05/2024      Component Value Date/Time   LABOPIA NONE DETECTED 02/04/2024 1434   COCAINSCRNUR NONE DETECTED 02/04/2024 1434   LABBENZ NONE DETECTED 02/04/2024 1434   AMPHETMU NONE DETECTED 02/04/2024 1434   THCU NONE DETECTED 02/04/2024 1434   LABBARB NONE DETECTED 02/04/2024 1434    Recent Labs  Lab 02/04/24 1448  ETH <15    I have personally reviewed the radiological images below and agree with the radiology interpretations.  ECHOCARDIOGRAM COMPLETE Result Date: 02/05/2024    ECHOCARDIOGRAM REPORT   Patient Name:   [name] [name] Date of Exam: 02/05/2024 Medical Rec #:  [id number]    Height:       62.0 in Accession #:    [id number]   Weight:       113.5 lb Date of Birth:  January 25, 1951    BSA:          1.503 m Patient Age:    72 years     BP:           123/81 mmHg Patient Gender: F            HR:  84 bpm. Exam Location:  Inpatient Procedure: 2D Echo, Cardiac Doppler and Color Doppler (Both Spectral and Color            Flow Doppler were utilized during procedure). Indications:    TIA  History:        Patient has no prior history of Echocardiogram examinations.                 Risk Factors:Hypertension and Dyslipidemia.  Sonographer:    Christiana Mbomeh Referring Phys: [id number] SUBRINA SUNDIL IMPRESSIONS  1. Left ventricular ejection fraction, by estimation, is 60 to 65%. The left ventricle has normal function. The left ventricle has no regional wall motion abnormalities. Left ventricular diastolic parameters are consistent with Grade I diastolic dysfunction (impaired relaxation).  2. Right ventricular systolic function is normal. The right ventricular size is normal. There is normal pulmonary artery systolic pressure. The estimated right ventricular systolic pressure is 21.0 mmHg.  3. The mitral valve is normal in  structure. No evidence of mitral valve regurgitation. No evidence of mitral stenosis.  4. The aortic valve is normal in structure. Aortic valve regurgitation is not visualized. No aortic stenosis is present.  5. The inferior vena cava is normal in size with greater than 50% respiratory variability, suggesting right atrial pressure of 3 mmHg. FINDINGS  Left Ventricle: Left ventricular ejection fraction, by estimation, is 60 to 65%. The left ventricle has normal function. The left ventricle has no regional wall motion abnormalities. The left ventricular internal cavity size was normal in size. There is  no left ventricular hypertrophy. Left ventricular diastolic parameters are consistent with Grade I diastolic dysfunction (impaired relaxation). Normal left ventricular filling pressure. Right Ventricle: The right ventricular size is normal. No increase in right ventricular wall thickness. Right ventricular systolic function is normal. There is normal pulmonary artery systolic pressure. The tricuspid regurgitant velocity is 2.12 m/s, and  with an assumed right atrial pressure of 3 mmHg, the estimated right ventricular systolic pressure is 21.0 mmHg. Left Atrium: Left atrial size was normal in size. Right Atrium: Right atrial size was normal in size. Pericardium: There is no evidence of pericardial effusion. Mitral Valve: The mitral valve is normal in structure. No evidence of mitral valve regurgitation. No evidence of mitral valve stenosis. Tricuspid Valve: The tricuspid valve is normal in structure. Tricuspid valve regurgitation is trivial. No evidence of tricuspid stenosis. Aortic Valve: The aortic valve is normal in structure. Aortic valve regurgitation is not visualized. No aortic stenosis is present. Aortic valve mean gradient measures 6.0 mmHg. Aortic valve peak gradient measures 12.7 mmHg. Aortic valve area, by VTI measures 2.48 cm. Pulmonic Valve: The pulmonic valve was normal in structure. Pulmonic valve  regurgitation is not visualized. No evidence of pulmonic stenosis. Aorta: The aortic root is normal in size and structure. Venous: The inferior vena cava is normal in size with greater than 50% respiratory variability, suggesting right atrial pressure of 3 mmHg. IAS/Shunts: The interatrial septum is aneurysmal. No atrial level shunt detected by color flow Doppler.  LEFT VENTRICLE PLAX 2D LVIDd:         3.40 cm   Diastology LVIDs:         2.20 cm   LV e' medial:    5.98 cm/s LV PW:         1.00 cm   LV E/e' medial:  11.9 LV IVS:        1.50 cm   LV e' lateral:   8.59 cm/s LVOT diam:  2.10 cm   LV E/e' lateral: 8.3 LV SV:         96 LV SV Index:   64 LVOT Area:     3.46 cm  RIGHT VENTRICLE RV S prime:     10.60 cm/s TAPSE (M-mode): 1.7 cm LEFT ATRIUM             Index        RIGHT ATRIUM          Index LA Vol (A2C):   20.6 ml 13.71 ml/m  RA Area:     8.85 cm LA Vol (A4C):   22.9 ml 15.24 ml/m  RA Volume:   14.20 ml 9.45 ml/m LA Biplane Vol: 23.8 ml 15.84 ml/m  AORTIC VALVE AV Area (Vmax):    2.24 cm AV Area (Vmean):   2.46 cm AV Area (VTI):     2.48 cm AV Vmax:           178.00 cm/s AV Vmean:          118.000 cm/s AV VTI:            0.387 m AV Peak Grad:      12.7 mmHg AV Mean Grad:      6.0 mmHg LVOT Vmax:         115.00 cm/s LVOT Vmean:        83.800 cm/s LVOT VTI:          0.277 m LVOT/AV VTI ratio: 0.72  AORTA Ao Root diam: 3.10 cm Ao Asc diam:  3.80 cm MITRAL VALVE               TRICUSPID VALVE MV Area (PHT): 6.02 cm    TR Peak grad:   18.0 mmHg MV Decel Time: 126 msec    TR Vmax:        212.00 cm/s MV E velocity: 71.30 cm/s MV A velocity: 94.90 cm/s  SHUNTS MV E/A ratio:  0.75        Systemic VTI:  0.28 m                            Systemic Diam: 2.10 cm Jerel Croitoru MD Electronically signed by Jerel Balding MD Signature Date/Time: 02/05/2024/12:19:57 PM    Final    MR BRAIN WO CONTRAST Result Date: 02/04/2024 EXAM: MRI BRAIN WITHOUT CONTRAST 02/04/2024 10:04:00 PM TECHNIQUE: Multiplanar  multisequence MRI of the head/brain was performed without the administration of intravenous contrast. COMPARISON: None available. CLINICAL HISTORY: Jobie out a stroke. FINDINGS: BRAIN AND VENTRICLES: No acute infarct. No intracranial hemorrhage. No mass. No midline shift. No hydrocephalus. The sella is unremarkable. Normal flow voids. Multifocal hyperintense T2-weighted signal within the cerebral white matter, most commonly due to chronic small vessel disease. ORBITS: No acute abnormality. SINUSES AND MASTOIDS: No acute abnormality. BONES AND SOFT TISSUES: Normal marrow signal. No acute soft tissue abnormality. IMPRESSION: 1. No acute intracranial abnormality. 2. Multifocal hyperintense T2-weighted signal within the cerebral white matter, most commonly due to chronic small vessel disease. Electronically signed by: Franky Stanford MD 02/04/2024 10:37 PM EDT RP Workstation: [id number]   CT HEAD CODE STROKE WO CONTRAST Addendum Date: 02/04/2024 ADDENDUM #1 ADDENDUM: Code stroke results were communicated to Dr. Patsey at 2:56 PM on 02/04/2024 by phone. ---------------------------------------------------- Electronically signed by: Ryan Chess MD 02/04/2024 03:03 PM EDT RP Workstation: [id number]   Result Date: 02/04/2024 ORIGINAL REPORTEXAM: CT HEAD WITHOUT CONTRAST 02/04/2024 02:42:32 PM TECHNIQUE: CT of  the head was performed without the administration of intravenous contrast. Automated exposure control, iterative reconstruction, and/or weight based adjustment of the mA/kV was utilized to reduce the radiation dose to as low as reasonably achievable. COMPARISON: None available. CLINICAL HISTORY: Neuro deficit, acute, stroke suspected. FINDINGS: BRAIN AND VENTRICLES: No acute hemorrhage. Gray-white differentiation is preserved. No hydrocephalus. No extra-axial collection. No mass effect or midline shift. Mild patchy hypoattenuation of the periventricular white matter, most consistent with mild chronic small  vessel disease. [place] stroke program early CT (ASPECT) score ----- Ganglionic (caudate, IC, Lentiform Nucleus, insula, M1-M3): 7 Supraganglionic (M4-M6): 3 Total: 10 ORBITS: No acute abnormality. SINUSES: No acute abnormality. SOFT TISSUES AND SKULL: No acute soft tissue abnormality. No skull fracture. IMPRESSION: 1. No acute intracranial abnormality. ASPECT score: 10. 2. Mild chronic small vessel disease in the periventricular white matter. The professional radiologist assistant staff have been asked to put me in contact with the referring provider for communication of results. An addendum will be submitted to document this communication. Electronically signed by: Ryan Chess MD 02/04/2024 02:50 PM EDT RP Workstation: [id number]   CT ANGIO HEAD NECK W WO CM (CODE STROKE) Result Date: 02/04/2024 EXAM: CTA HEAD AND NECK WITH AND WITHOUT 02/04/2024 02:52:00 PM TECHNIQUE: CTA of the head and neck was performed with and without the administration of intravenous contrast. Multiplanar 2D and/or 3D reformatted images are provided for review. Automated exposure control, iterative reconstruction, and/or weight based adjustment of the mA/kV was utilized to reduce the radiation dose to as low as reasonably achievable. Stenosis of the internal carotid arteries measured using NASCET criteria. COMPARISON: CT head 02/04/2024 CLINICAL HISTORY: Neuro deficit, acute, stroke suspected. Pt presents with expressive aphasia that started at 13:00 today. Pt reports that she started having some difficulty getting her words out last night, but then the symptoms resolved and she was fine today. Then she started having expressive aphasia again at 13:00 today. She does report she had some associated HA and numbness on her R side. Code Stroke was called and EDP at bedside. After assessing pt EDP adjusting LKW to 20:00 last PM. FINDINGS: CTA NECK: AORTIC ARCH AND ARCH VESSELS: Two-vessel arch configuration with common origin of the left  common carotid and right brachiocephalic arteries. No dissection or arterial injury. No significant stenosis of the brachiocephalic or subclavian arteries. CERVICAL CAROTID ARTERIES: No dissection, arterial injury, or hemodynamically significant stenosis by NASCET criteria. CERVICAL VERTEBRAL ARTERIES: No dissection, arterial injury, or significant stenosis. LUNGS AND MEDIASTINUM: Unremarkable. SOFT TISSUES: No acute abnormality. BONES: No acute abnormality. CTA HEAD: ANTERIOR CIRCULATION: No significant stenosis of the internal carotid arteries. No significant stenosis of the anterior cerebral arteries. No significant stenosis of the middle cerebral arteries. No aneurysm. POSTERIOR CIRCULATION: No significant stenosis of the posterior cerebral arteries. No significant stenosis of the basilar artery. No significant stenosis of the vertebral arteries. No aneurysm. OTHER: Azygous A2 segment of the ACAs. No dural venous sinus thrombosis on this non-dedicated study. IMPRESSION: 1. No large vessel occlusion, hemodynamically significant stenosis, or aneurysm in the head or neck. Code stroke results were communicated to Dr. Patsey at 2:56 PM on 02/04/2024 by phone. Electronically signed by: Ryan Chess MD 02/04/2024 03:02 PM EDT RP Workstation: [id number]     PHYSICAL EXAM  Temp:  [97.4 F (36.3 C)-98.2 F (36.8 C)] 98.2 F (36.8 C) (07/19 1232) Pulse Rate:  [79-106] 99 (07/19 1232) Resp:  [10-18] 18 (07/19 1232) BP: (118-182)/(81-107) 118/86 (07/19 1232) SpO2:  [96 %-99 %] 97 % (  07/19 1232) Weight:  [51.5 kg] 51.5 kg (07/18 2009)  General - Well nourished, well developed, in no apparent distress.  Ophthalmologic - fundi not visualized due to noncooperation.  Cardiovascular - Regular rhythm and rate.  Mental Status -  Level of arousal and orientation to time, place, and person were intact. Language including expression, naming, repetition, comprehension was assessed and found intact. Attention  span and concentration were normal. Fund of Knowledge was assessed and was intact.  Cranial Nerves II - XII - II - Visual field intact OU. III, IV, VI - Extraocular movements intact. V - Facial sensation intact bilaterally. VII - Facial movement intact bilaterally. VIII - Hearing & vestibular intact bilaterally. X - Palate elevates symmetrically. XI - Chin turning & shoulder shrug intact bilaterally. XII - Tongue protrusion intact.  Motor Strength - The patient's strength was normal in all extremities and pronator drift was absent.  Bulk was normal and fasciculations were absent.   Motor Tone - Muscle tone was assessed at the neck and appendages and was normal.  Reflexes - The patient's reflexes were symmetrical in all extremities and she had no pathological reflexes.  Sensory - Light touch, temperature/pinprick were assessed and were symmetrical.    Coordination - The patient had normal movements in the hands and feet with no ataxia or dysmetria.  Tremor was absent.  Gait and Station - deferred.   ASSESSMENT/PLAN [name] [name] is a 73 y.o. female with history of HTN, HLD, preDM admitted for 2 episodes of HA, right visual disturbance and speech difficulty with right finger tingling. No TNK given due to symptoms resolved.    TIA vs. Complicated migraine  She was in her neighbor's house Thursday and had some visual disturbance on the right eye, some wave-like images. She went home and had difficulty speaking and right fingers tingling with back of head pain, lasted about . Friday, it happened again with back of head pain, right eye visual disturbance after lunch, lasted about . Currently she is at baseline. Denies hx of migraine, denies any triggers that she could think of.  CT no acute finding CTA head and neck unremarkable MRI  no acute infarct 2D Echo  EF 60-65% LDL 184 HgbA1c 6.2 UDS neg lovenox  for VTE prophylaxis No antithrombotic prior to admission, now on  aspirin  81 mg daily and clopidogrel  75 mg daily DAPT for 3 weeks and then ASA alone. Patient counseled to be compliant with her antithrombotic medications Ongoing aggressive stroke risk factor management Therapy recommendations:  none Disposition:  home  Hypertension Stable Long term BP goal normotensive  Hyperlipidemia Home meds:  none  LDL 184, goal < 70 Now on lipitor 40 Pt has intolerance to crestor  in the past, now willing to try lower dose of lipitor Continue statin at discharge  Other Stroke Risk Factors Advanced age preDM with A1C 6.2  Other Active Problems   Hospital day # 0  Neurology will sign off. Please call with questions. Pt will follow up with stroke clinic NP at [facility] in about 4 weeks. Thanks for the consult.   Ary Cummins, MD PhD Stroke Neurology 02/05/2024 12:46 PM    To contact Stroke Continuity provider, please refer to [email or web address]. After hours, contact General Neurology

## 2024-02-05 NOTE — Discharge Summary (Signed)
 Physician Discharge Summary   Patient: [name] [name] MRN: [id number] DOB: [birth year]  Admit date:     02/04/2024  Discharge date: 02/05/24  Discharge Physician: Claretta CHRISTELLA Alderman   PCP: Arloa Elsie SAUNDERS, MD   Recommendations at discharge:   Please ensure patient completes DAPT with aspirin  and Plavix  for 3 weeks then aspirin  alone Check BP and consider an antihypertensive if BP is not at goal Ensure patient follows up with neurology  Discharge Diagnoses: Principal Problem:   TIA (transient ischemic attack) Active Problems:   Hyperlipidemia   Essential hypertension   History of aortic aneurysm   Acquired hypothyroidism  Resolved Problems:   * No resolved hospital problems. *  Hospital Course: [name] [name] is a 73 y.o. female with medical history significant of ascending aortic aneurysm, essential hypertension, hyperlipidemia and acquired hypothyroidism who presented to med [facility] emergency department with complaining of word finding difficulty and right upper extremity paresthesia that started around 8 PM 7/17, initially symptom has been resolved again came back around 1 PM which prompted her come to the ED for evaluation. Transferred to [facility] for TIA workup.   Assessment and Plan: Transient ischemic attack -Patient initially presented emergency department complaining of word finding difficulty and left-sided upper extremity numbness 2 episodes at 8 PM last night and second episode around 1 PM today which has been resolved at 3 PM.  Currently patient does not have any complaint. -MRI brain and CTH with no acute intracranial abnormality, CTA head and neck with no LVO -Echocardiogram showed EF 60-65%, G1DD but no valvular abnormalities. -A1c 6.2%, LDL 184 -Patient remains asymptomatic and eager to go home -Evaluated by neurology and recommends outpatient follow-up -DAPT with Plavix  75 mg and aspirin  81 mg daily for 3 weeks then aspirin  alone -Patient did not have any  acute PT/OT/SLP needs  History of essential hypertension -Patient not on any antihypertensive meds -BP stable with SBP in the 110s to 140s -BP goal <130/80, follow-up with PCP for repeat BP in the outpatient and initiating antihypertensive if appropriate.   History of hyperlipidemia -History of muscle pain with rosuvastatin  -Start atorvastatin  40 mg daily -Discontinue red yeast extract and continue omega-3 fatty acid   Hypothyroidism -Patient has history of thyroid  tumor in the past status post removal of the thyroid  gland -Continue levothyroxine  125 mcg at home.   History of aortic aneurysm -Per chart review previous CT MRI of the aorta from 03/09/2023 showedAortic atherosclerosis with mild aneurysmal dilatation of the ascending aorta measuring 4.0 cm.  -Recommend annual imaging followup.       Consultants: Neurology Procedures performed: None Disposition: Home Diet recommendation:  Regular diet DISCHARGE MEDICATION: Allergies as of 02/05/2024       Reactions   Azithromycin Nausea Only   Erythromycin    Other reaction(s): GI upset   Rosuvastatin  Calcium     Other reaction(s): myalgia   Penicillin G    Other reaction(s): childhood, Other (See Comments) fainting        Medication List     STOP taking these medications    [employer] Red Yeast Rice 600 MG Caps Generic drug: Red Yeast Rice Extract       TAKE these medications    aspirin  EC 81 MG tablet Take 1 tablet (81 mg total) by mouth daily. Swallow whole. Start taking on: February 06, 2024   atorvastatin  40 MG tablet Commonly known as: LIPITOR Take 1 tablet (40 mg total) by mouth daily.   BEET ROOT PO  Take 1 Dose by mouth daily.   CALCIUM  PO Take 2 Doses by mouth daily.   clopidogrel  75 MG tablet Commonly known as: PLAVIX  Take 1 tablet (75 mg total) by mouth daily. Start taking on: February 06, 2024   levothyroxine  125 MCG tablet Commonly known as: SYNTHROID  Take 125 mcg by mouth every morning.    Multi-Vitamin tablet Take 1 tablet by mouth daily.   NON FORMULARY Provasil (memory boost)   Omega 3 1000 MG Caps Take 1 capsule by mouth daily.   Probiotic 250 MG Caps Take 1 capsule by mouth daily.        Follow-up Information     Arloa Elsie SAUNDERS, MD. Schedule an appointment as soon as possible for a visit in 1 week(s).   Specialty: Family Medicine Contact information: [address] [place] [address] [phone]         [facility]. Schedule an appointment as soon as possible for a visit in 1 month(s).   Specialty: Neurology Why: stroke clinic Contact information: [address] [place]  [address] [phone]               Discharge Exam: Fredricka Weights   02/04/24 2009  Weight: 51.5 kg   Subjective: Evaluated at the bedside with spouse in the room.  Patient remains asymptomatic and denies any headache, vision changes, numbness, tingling, weakness, chest pain or palpitations.  They are concerned about the cost of her medications while in the hospital and eager for discharge later today. Discussed the reasons for her medications and the importance of taking them as prescribed to decrease her risk of a second cardiovascular event.  General: Pleasant, well-appearing elderly woman laying in bed. No acute distress. HEENT: [place]/AT. Anicteric sclera.  PERRLA. EOMI. CV: RRR. No murmurs, rubs, or gallops. No LE edema Pulmonary: Lungs CTAB. Normal effort. No wheezing or rales. Abdominal: Soft, nontender, nondistended. Normal bowel sounds. Extremities: Palpable radial and DP pulses. Normal ROM. Skin: Warm and dry. No obvious rash or lesions. Neuro: A&Ox3. Moves all extremities. Strength 5/5 in all extremities. Normal sensation to light touch. No focal deficit. Psych: Normal mood and affect   Condition at discharge: good  The results of significant diagnostics from this hospitalization  (including imaging, microbiology, ancillary and laboratory) are listed below for reference.   Imaging Studies: ECHOCARDIOGRAM COMPLETE Result Date: 02/05/2024    ECHOCARDIOGRAM REPORT   Patient Name:   MISSIE GEHRIG Date of Exam: 02/05/2024 Medical Rec #:  [id number]    Height:       62.0 in Accession #:    [id number]   Weight:       113.5 lb Date of Birth:  02/17/1951    BSA:          1.503 m Patient Age:    72 years     BP:           123/81 mmHg Patient Gender: F            HR:           84 bpm. Exam Location:  Inpatient Procedure: 2D Echo, Cardiac Doppler and Color Doppler (Both Spectral and Color            Flow Doppler were utilized during procedure). Indications:    TIA  History:        Patient has no prior history of Echocardiogram examinations.  Risk Factors:Hypertension and Dyslipidemia.  Sonographer:    Christiana Mbomeh Referring Phys: [id number] SUBRINA SUNDIL IMPRESSIONS  1. Left ventricular ejection fraction, by estimation, is 60 to 65%. The left ventricle has normal function. The left ventricle has no regional wall motion abnormalities. Left ventricular diastolic parameters are consistent with Grade I diastolic dysfunction (impaired relaxation).  2. Right ventricular systolic function is normal. The right ventricular size is normal. There is normal pulmonary artery systolic pressure. The estimated right ventricular systolic pressure is 21.0 mmHg.  3. The mitral valve is normal in structure. No evidence of mitral valve regurgitation. No evidence of mitral stenosis.  4. The aortic valve is normal in structure. Aortic valve regurgitation is not visualized. No aortic stenosis is present.  5. The inferior vena cava is normal in size with greater than 50% respiratory variability, suggesting right atrial pressure of 3 mmHg. FINDINGS  Left Ventricle: Left ventricular ejection fraction, by estimation, is 60 to 65%. The left ventricle has normal function. The left ventricle has no regional wall  motion abnormalities. The left ventricular internal cavity size was normal in size. There is  no left ventricular hypertrophy. Left ventricular diastolic parameters are consistent with Grade I diastolic dysfunction (impaired relaxation). Normal left ventricular filling pressure. Right Ventricle: The right ventricular size is normal. No increase in right ventricular wall thickness. Right ventricular systolic function is normal. There is normal pulmonary artery systolic pressure. The tricuspid regurgitant velocity is 2.12 m/s, and  with an assumed right atrial pressure of 3 mmHg, the estimated right ventricular systolic pressure is 21.0 mmHg. Left Atrium: Left atrial size was normal in size. Right Atrium: Right atrial size was normal in size. Pericardium: There is no evidence of pericardial effusion. Mitral Valve: The mitral valve is normal in structure. No evidence of mitral valve regurgitation. No evidence of mitral valve stenosis. Tricuspid Valve: The tricuspid valve is normal in structure. Tricuspid valve regurgitation is trivial. No evidence of tricuspid stenosis. Aortic Valve: The aortic valve is normal in structure. Aortic valve regurgitation is not visualized. No aortic stenosis is present. Aortic valve mean gradient measures 6.0 mmHg. Aortic valve peak gradient measures 12.7 mmHg. Aortic valve area, by VTI measures 2.48 cm. Pulmonic Valve: The pulmonic valve was normal in structure. Pulmonic valve regurgitation is not visualized. No evidence of pulmonic stenosis. Aorta: The aortic root is normal in size and structure. Venous: The inferior vena cava is normal in size with greater than 50% respiratory variability, suggesting right atrial pressure of 3 mmHg. IAS/Shunts: The interatrial septum is aneurysmal. No atrial level shunt detected by color flow Doppler.  LEFT VENTRICLE PLAX 2D LVIDd:         3.40 cm   Diastology LVIDs:         2.20 cm   LV e' medial:    5.98 cm/s LV PW:         1.00 cm   LV E/e' medial:   11.9 LV IVS:        1.50 cm   LV e' lateral:   8.59 cm/s LVOT diam:     2.10 cm   LV E/e' lateral: 8.3 LV SV:         96 LV SV Index:   64 LVOT Area:     3.46 cm  RIGHT VENTRICLE RV S prime:     10.60 cm/s TAPSE (M-mode): 1.7 cm LEFT ATRIUM             Index  RIGHT ATRIUM          Index LA Vol (A2C):   20.6 ml 13.71 ml/m  RA Area:     8.85 cm LA Vol (A4C):   22.9 ml 15.24 ml/m  RA Volume:   14.20 ml 9.45 ml/m LA Biplane Vol: 23.8 ml 15.84 ml/m  AORTIC VALVE AV Area (Vmax):    2.24 cm AV Area (Vmean):   2.46 cm AV Area (VTI):     2.48 cm AV Vmax:           178.00 cm/s AV Vmean:          118.000 cm/s AV VTI:            0.387 m AV Peak Grad:      12.7 mmHg AV Mean Grad:      6.0 mmHg LVOT Vmax:         115.00 cm/s LVOT Vmean:        83.800 cm/s LVOT VTI:          0.277 m LVOT/AV VTI ratio: 0.72  AORTA Ao Root diam: 3.10 cm Ao Asc diam:  3.80 cm MITRAL VALVE               TRICUSPID VALVE MV Area (PHT): 6.02 cm    TR Peak grad:   18.0 mmHg MV Decel Time: 126 msec    TR Vmax:        212.00 cm/s MV E velocity: 71.30 cm/s MV A velocity: 94.90 cm/s  SHUNTS MV E/A ratio:  0.75        Systemic VTI:  0.28 m                            Systemic Diam: 2.10 cm Jerel Croitoru MD Electronically signed by Jerel Balding MD Signature Date/Time: 02/05/2024/12:19:57 PM    Final    MR BRAIN WO CONTRAST Result Date: 02/04/2024 EXAM: MRI BRAIN WITHOUT CONTRAST 02/04/2024 10:04:00 PM TECHNIQUE: Multiplanar multisequence MRI of the head/brain was performed without the administration of intravenous contrast. COMPARISON: None available. CLINICAL HISTORY: Jobie out a stroke. FINDINGS: BRAIN AND VENTRICLES: No acute infarct. No intracranial hemorrhage. No mass. No midline shift. No hydrocephalus. The sella is unremarkable. Normal flow voids. Multifocal hyperintense T2-weighted signal within the cerebral white matter, most commonly due to chronic small vessel disease. ORBITS: No acute abnormality. SINUSES AND MASTOIDS: No acute  abnormality. BONES AND SOFT TISSUES: Normal marrow signal. No acute soft tissue abnormality. IMPRESSION: 1. No acute intracranial abnormality. 2. Multifocal hyperintense T2-weighted signal within the cerebral white matter, most commonly due to chronic small vessel disease. Electronically signed by: Franky Stanford MD 02/04/2024 10:37 PM EDT RP Workstation: [id number]   CT HEAD CODE STROKE WO CONTRAST Addendum Date: 02/04/2024 ADDENDUM #1 : Code stroke results were communicated to Dr. Patsey at 2:56 PM on 02/04/2024 by phone. ---------------------------------------------------- Electronically signed by: Ryan Chess MD 02/04/2024 03:03 PM EDT RP Workstation: [id number]   Result Date: 02/04/2024 ORIGINAL REPORT  EXAM: CT HEAD WITHOUT CONTRAST 02/04/2024 02:42:32 PM TECHNIQUE: CT of the head was performed without the administration of intravenous contrast. Automated exposure control, iterative reconstruction, and/or weight based adjustment of the mA/kV was utilized to reduce the radiation dose to as low as reasonably achievable. COMPARISON: None available. CLINICAL HISTORY: Neuro deficit, acute, stroke suspected. FINDINGS: BRAIN AND VENTRICLES: No acute hemorrhage. Gray-white differentiation is preserved. No hydrocephalus. No extra-axial collection. No mass effect or midline shift. Mild patchy  hypoattenuation of the periventricular white matter, most consistent with mild chronic small vessel disease. [place] stroke program early CT (ASPECT) score ----- Ganglionic (caudate, IC, Lentiform Nucleus, insula, M1-M3): 7 Supraganglionic (M4-M6): 3 Total: 10 ORBITS: No acute abnormality. SINUSES: No acute abnormality. SOFT TISSUES AND SKULL: No acute soft tissue abnormality. No skull fracture. IMPRESSION: 1. No acute intracranial abnormality. ASPECT score: 10. 2. Mild chronic small vessel disease in the periventricular white matter. The professional radiologist assistant staff have been asked to put me in contact with the  referring provider for communication of results. An addendum will be submitted to document this communication. Electronically signed by: Ryan Chess MD 02/04/2024 02:50 PM EDT RP Workstation: [id number]   CT ANGIO HEAD NECK W WO CM (CODE STROKE) Result Date: 02/04/2024 EXAM: CTA HEAD AND NECK WITH AND WITHOUT 02/04/2024 02:52:00 PM TECHNIQUE: CTA of the head and neck was performed with and without the administration of intravenous contrast. Multiplanar 2D and/or 3D reformatted images are provided for review. Automated exposure control, iterative reconstruction, and/or weight based adjustment of the mA/kV was utilized to reduce the radiation dose to as low as reasonably achievable. Stenosis of the internal carotid arteries measured using NASCET criteria. COMPARISON: CT head 02/04/2024 CLINICAL HISTORY: Neuro deficit, acute, stroke suspected. Pt presents with expressive aphasia that started at 13:00 today. Pt reports that she started having some difficulty getting her words out last night, but then the symptoms resolved and she was fine today. Then she started having expressive aphasia again at 13:00 today. She does report she had some associated HA and numbness on her R side. Code Stroke was called and EDP at bedside. After assessing pt EDP adjusting LKW to 20:00 last PM. FINDINGS: CTA NECK: AORTIC ARCH AND ARCH VESSELS: Two-vessel arch configuration with common origin of the left common carotid and right brachiocephalic arteries. No dissection or arterial injury. No significant stenosis of the brachiocephalic or subclavian arteries. CERVICAL CAROTID ARTERIES: No dissection, arterial injury, or hemodynamically significant stenosis by NASCET criteria. CERVICAL VERTEBRAL ARTERIES: No dissection, arterial injury, or significant stenosis. LUNGS AND MEDIASTINUM: Unremarkable. SOFT TISSUES: No acute abnormality. BONES: No acute abnormality. CTA HEAD: ANTERIOR CIRCULATION: No significant stenosis of the internal  carotid arteries. No significant stenosis of the anterior cerebral arteries. No significant stenosis of the middle cerebral arteries. No aneurysm. POSTERIOR CIRCULATION: No significant stenosis of the posterior cerebral arteries. No significant stenosis of the basilar artery. No significant stenosis of the vertebral arteries. No aneurysm. OTHER: Azygous A2 segment of the ACAs. No dural venous sinus thrombosis on this non-dedicated study. IMPRESSION: 1. No large vessel occlusion, hemodynamically significant stenosis, or aneurysm in the head or neck. Code stroke results were communicated to Dr. Patsey at 2:56 PM on 02/04/2024 by phone. Electronically signed by: Ryan Chess MD 02/04/2024 03:02 PM EDT RP Workstation: [id number]    Microbiology: No results found for this or any previous visit.  Labs: CBC: Recent Labs  Lab 02/04/24 1448 02/05/24 0303  WBC 7.5 9.4  NEUTROABS 4.5  --   HGB 15.0 14.4  HCT 44.2 42.2  MCV 87.2 87.6  PLT 308 291   Basic Metabolic Panel: Recent Labs  Lab 02/04/24 1448 02/05/24 0303  NA 137 137  K 3.5 3.7  CL 99 103  CO2 27 26  GLUCOSE 208* 111*  BUN 15 11  CREATININE 0.71 0.60  CALCIUM  9.3 9.3   Liver Function Tests: Recent Labs  Lab 02/04/24 1448  AST 24  ALT 16  ALKPHOS 85  BILITOT 0.4  PROT 7.4  ALBUMIN 4.4   CBG: Recent Labs  Lab 02/04/24 1430  GLUCAP 192*    Discharge time spent: 35 minutes  Signed: Claretta CHRISTELLA Alderman, MD [facility] 02/05/2024

## 2024-02-05 NOTE — Plan of Care (Signed)
  Problem: Education: Goal: Knowledge of General Education information will improve Description: Including pain rating scale, medication(s)/side effects and non-pharmacologic comfort measures Outcome: Adequate for Discharge   Problem: Health Behavior/Discharge Planning: Goal: Ability to manage health-related needs will improve Outcome: Adequate for Discharge   Problem: Clinical Measurements: Goal: Ability to maintain clinical measurements within normal limits will improve Outcome: Adequate for Discharge Goal: Will remain free from infection Outcome: Adequate for Discharge Goal: Diagnostic test results will improve Outcome: Adequate for Discharge Goal: Respiratory complications will improve Outcome: Adequate for Discharge Goal: Cardiovascular complication will be avoided Outcome: Adequate for Discharge   Problem: Activity: Goal: Risk for activity intolerance will decrease Outcome: Adequate for Discharge   Problem: Nutrition: Goal: Adequate nutrition will be maintained Outcome: Adequate for Discharge   Problem: Coping: Goal: Level of anxiety will decrease Outcome: Adequate for Discharge   Problem: Elimination: Goal: Will not experience complications related to bowel motility Outcome: Adequate for Discharge Goal: Will not experience complications related to urinary retention Outcome: Adequate for Discharge   Problem: Pain Managment: Goal: General experience of comfort will improve and/or be controlled Outcome: Adequate for Discharge   Problem: Safety: Goal: Ability to remain free from injury will improve Outcome: Adequate for Discharge   Problem: Skin Integrity: Goal: Risk for impaired skin integrity will decrease Outcome: Adequate for Discharge   Problem: Education: Goal: Knowledge of disease or condition will improve Outcome: Adequate for Discharge Goal: Knowledge of secondary prevention will improve (MUST DOCUMENT ALL) Outcome: Adequate for Discharge Goal:  Knowledge of patient specific risk factors will improve (DELETE if not current risk factor) Outcome: Adequate for Discharge   Problem: Ischemic Stroke/TIA Tissue Perfusion: Goal: Complications of ischemic stroke/TIA will be minimized Outcome: Adequate for Discharge   Problem: Coping: Goal: Will verbalize positive feelings about self Outcome: Adequate for Discharge Goal: Will identify appropriate support needs Outcome: Adequate for Discharge   Problem: Health Behavior/Discharge Planning: Goal: Ability to manage health-related needs will improve Outcome: Adequate for Discharge Goal: Goals will be collaboratively established with patient/family Outcome: Adequate for Discharge   Problem: Self-Care: Goal: Ability to participate in self-care as condition permits will improve Outcome: Adequate for Discharge Goal: Verbalization of feelings and concerns over difficulty with self-care will improve Outcome: Adequate for Discharge Goal: Ability to communicate needs accurately will improve Outcome: Adequate for Discharge   Problem: Nutrition: Goal: Risk of aspiration will decrease Outcome: Adequate for Discharge Goal: Dietary intake will improve Outcome: Adequate for Discharge

## 2024-02-09 DIAGNOSIS — E7800 Pure hypercholesterolemia, unspecified: Secondary | ICD-10-CM | POA: Diagnosis not present

## 2024-02-09 DIAGNOSIS — E119 Type 2 diabetes mellitus without complications: Secondary | ICD-10-CM | POA: Diagnosis not present

## 2024-02-09 DIAGNOSIS — E039 Hypothyroidism, unspecified: Secondary | ICD-10-CM | POA: Diagnosis not present

## 2024-02-09 DIAGNOSIS — F411 Generalized anxiety disorder: Secondary | ICD-10-CM | POA: Diagnosis not present

## 2024-02-09 DIAGNOSIS — G459 Transient cerebral ischemic attack, unspecified: Secondary | ICD-10-CM | POA: Diagnosis not present

## 2024-02-23 DIAGNOSIS — L820 Inflamed seborrheic keratosis: Secondary | ICD-10-CM | POA: Diagnosis not present

## 2024-02-23 DIAGNOSIS — L821 Other seborrheic keratosis: Secondary | ICD-10-CM | POA: Diagnosis not present

## 2024-02-23 DIAGNOSIS — Z85820 Personal history of malignant melanoma of skin: Secondary | ICD-10-CM | POA: Diagnosis not present

## 2024-03-06 NOTE — Progress Notes (Unsigned)
 PATIENT: [name] [name] DOB: 12-Sep-1950  REASON FOR VISIT: follow up HISTORY FROM: patient PRIMARY NEUROLOGIST: Dr. Rosemarie  No chief complaint on file.    HISTORY OF PRESENT ILLNESS: Today   [name] [name] is a 73 y.o. female who has been followed in this office for ***. Returns today for follow-up.   Imaging:   CTA HEAD AND NECK: IMPRESSION: 1. No large vessel occlusion, hemodynamically significant stenosis, or aneurysm in the head or neck.  MRI BRAIN: IMPRESSION: 1. No acute intracranial abnormality. 2. Multifocal hyperintense T2-weighted signal within the cerebral white matter, most commonly due to chronic small vessel disease.   FU: Labs: ECHO: Discharge note Work? OSA?   HISTORY (copied from Hospital)[name] [name] is a 73 y.o. female with history of HTN, HLD, preDM admitted for 2 episodes of HA, right visual disturbance and speech difficulty with right finger tingling. No TNK given due to symptoms resolved.     TIA vs. Complicated migraine  She was in her neighbor's house Thursday and had some visual disturbance on the right eye, some wave-like images. She went home and had difficulty speaking and right fingers tingling with back of head pain, lasted about . Friday, it happened again with back of head pain, right eye visual disturbance after lunch, lasted about . Currently she is at baseline. Denies hx of migraine, denies any triggers that she could think of.  CT no acute finding CTA head and neck unremarkable MRI  no acute infarct 2D Echo  EF 60-65% LDL 184 HgbA1c 6.2 UDS neg lovenox  for VTE prophylaxis No antithrombotic prior to admission, now on aspirin  81 mg daily and clopidogrel  75 mg daily DAPT for 3 weeks and then ASA alone. Patient counseled to be compliant with her antithrombotic medications Ongoing aggressive stroke risk factor management Therapy recommendations:  none Disposition:  home   Hypertension Stable Long term BP goal  normotensive   Hyperlipidemia Home meds:  none  LDL 184, goal < 70 Now on lipitor 40 Pt has intolerance to crestor  in the past, now willing to try lower dose of lipitor Continue statin at discharge   Other Stroke Risk Factors Advanced age preDM with A1C 6.2  REVIEW OF SYSTEMS: Out of a complete 14 system review of symptoms, the patient complains only of the following symptoms, and all other reviewed systems are negative.  ALLERGIES: Allergies  Allergen Reactions   Azithromycin Nausea Only   Erythromycin     Other reaction(s): GI upset   Rosuvastatin  Calcium      Other reaction(s): myalgia   Penicillin G     Other reaction(s): childhood, Other (See Comments) fainting     HOME MEDICATIONS: Outpatient Medications Prior to Visit  Medication Sig Dispense Refill   aspirin  EC 81 MG tablet Take 1 tablet (81 mg total) by mouth daily. Swallow whole. 90 tablet 3   atorvastatin  (LIPITOR) 40 MG tablet Take 1 tablet (40 mg total) by mouth daily. 30 tablet 3   CALCIUM  PO Take 2 Doses by mouth daily.     clopidogrel  (PLAVIX ) 75 MG tablet Take 1 tablet (75 mg total) by mouth daily. 20 tablet 0   levothyroxine  (SYNTHROID ) 125 MCG tablet Take 125 mcg by mouth every morning.     Misc Natural Products (BEET ROOT PO) Take 1 Dose by mouth daily.     Multiple Vitamin (MULTI-VITAMIN) tablet Take 1 tablet by mouth daily.     NON FORMULARY Provasil (memory boost)     Omega  3 1000 MG CAPS Take 1 capsule by mouth daily.     Saccharomyces boulardii (PROBIOTIC) 250 MG CAPS Take 1 capsule by mouth daily.     No facility-administered medications prior to visit.    PAST MEDICAL HISTORY: No past medical history on file.  PAST SURGICAL HISTORY: Past Surgical History:  Procedure Laterality Date   APPENDECTOMY     EYE SURGERY     THYROID  SURGERY     TUBAL LIGATION      FAMILY HISTORY: Family History  Problem Relation Age of Onset   Cancer Mother    Heart attack Father    Heart attack Brother      SOCIAL HISTORY: Social History   Socioeconomic History   Marital status: Married    Spouse name: Not on file   Number of children: 2   Years of education: Not on file   Highest education level: Not on file  Occupational History   Not on file  Tobacco Use   Smoking status: Never   Smokeless tobacco: Never  Vaping Use   Vaping status: Never Used  Substance and Sexual Activity   Alcohol use: Not Currently   Drug use: Never   Sexual activity: Not on file  Other Topics Concern   Not on file  Social History Narrative   Not on file   Social Drivers of Health   Financial Resource Strain: Not on file  Food Insecurity: No Food Insecurity (02/04/2024)   Hunger Vital Sign    Worried About Running Out of Food in the Last Year: Never true    Ran Out of Food in the Last Year: Never true  Transportation Needs: No Transportation Needs (02/04/2024)   PRAPARE - Administrator, Civil Service (Medical): No    Lack of Transportation (Non-Medical): No  Physical Activity: Not on file  Stress: Not on file  Social Connections: Unknown (02/04/2024)   Social Connection and Isolation Panel    Frequency of Communication with Friends and Family: Patient declined    Frequency of Social Gatherings with Friends and Family: Patient declined    Attends Religious Services: Patient declined    Database administrator or Organizations: Patient declined    Attends Banker Meetings: Patient declined    Marital Status: Married  Catering manager Violence: Not At Risk (02/04/2024)   Humiliation, Afraid, Rape, and Kick questionnaire    Fear of Current or Ex-Partner: No    Emotionally Abused: No    Physically Abused: No    Sexually Abused: No      PHYSICAL EXAM  There were no vitals filed for this visit. There is no height or weight on file to calculate BMI.  Generalized: Well developed, in no acute distress   Neurological examination  Mentation: Alert oriented to time,  place, history taking. Follows all commands speech and language fluent Cranial nerve II-XII: Pupils were equal round reactive to light. Extraocular movements were full, visual field were full on confrontational test. Facial sensation and strength were normal. Uvula tongue midline. Head turning and shoulder shrug  were normal and symmetric. Motor: The motor testing reveals 5 over 5 strength of all 4 extremities. Good symmetric motor tone is noted throughout.  Sensory: Sensory testing is intact to soft touch on all 4 extremities. No evidence of extinction is noted.  Coordination: Cerebellar testing reveals good finger-nose-finger and heel-to-shin bilaterally.  Gait and station: Gait is normal. Tandem gait is normal. Romberg is negative. No drift is  seen.  Reflexes: Deep tendon reflexes are symmetric and normal bilaterally.   DIAGNOSTIC DATA (LABS, IMAGING, TESTING) - I reviewed patient records, labs, notes, testing and imaging myself where available.  Lab Results  Component Value Date   WBC 9.4 02/05/2024   HGB 14.4 02/05/2024   HCT 42.2 02/05/2024   MCV 87.6 02/05/2024   PLT 291 02/05/2024      Component Value Date/Time   NA 137 02/05/2024 0303   K 3.7 02/05/2024 0303   CL 103 02/05/2024 0303   CO2 26 02/05/2024 0303   GLUCOSE 111 (H) 02/05/2024 0303   BUN 11 02/05/2024 0303   CREATININE 0.60 02/05/2024 0303   CALCIUM  9.3 02/05/2024 0303   PROT 7.4 02/04/2024 1448   ALBUMIN 4.4 02/04/2024 1448   AST 24 02/04/2024 1448   ALT 16 02/04/2024 1448   ALKPHOS 85 02/04/2024 1448   BILITOT 0.4 02/04/2024 1448   GFRNONAA >60 02/05/2024 0303   Lab Results  Component Value Date   CHOL 270 (H) 02/05/2024   HDL 61 02/05/2024   LDLCALC 184 (H) 02/05/2024   TRIG 126 02/05/2024   CHOLHDL 4.4 02/05/2024   Lab Results  Component Value Date   HGBA1C 6.2 (H) 02/05/2024   No results found for: VITAMINB12 No results found for: TSH    ASSESSMENT AND PLAN 72 y.o. year old female  has  no past medical history on file. here with ***     Continue aspirin  81 mg daily  and ***  for secondary stroke prevention.   Discussed secondary stroke prevention measures and importance of close PCP follow up for aggressive stroke risk factor management. I have gone over the pathophysiology of stroke, warning signs and symptoms, risk factors and their management in some detail with instructions to go to the closest emergency room for symptoms of concern. HTN: BP goal <130/90.  Stable on *** per PCP HLD: LDL goal <70. Recent LDL 182.  DMII: A1c goal<7.0. Recent A1c 6.2.  Encouraged patient to monitor diet and encouraged exercise FU with our office ***  No orders of the defined types were placed in this encounter.  No orders of the defined types were placed in this encounter.     Duwaine Russell, MSN, NP-C 03/06/2024, 3:46 PM [facility] [address], [address] [place], [place] [address] [phone]

## 2024-03-07 ENCOUNTER — Ambulatory Visit: Admitting: Adult Health

## 2024-03-07 ENCOUNTER — Encounter: Payer: Self-pay | Admitting: Adult Health

## 2024-03-07 VITALS — BP 140/90 | HR 102

## 2024-03-07 DIAGNOSIS — E785 Hyperlipidemia, unspecified: Secondary | ICD-10-CM

## 2024-03-07 DIAGNOSIS — G459 Transient cerebral ischemic attack, unspecified: Secondary | ICD-10-CM | POA: Diagnosis not present

## 2024-03-07 NOTE — Patient Instructions (Signed)
 Your Plan:  Continue aspirin  81 mg daily Blood pressure goal <130/90 Cholesterol LDL goal <70 Diabetes goal A1c <7 Monitor diet and try to exercise   Thank you for coming to see us  at [facility]. I hope we have been able to provide you high quality care today.  You may receive a patient satisfaction survey over the next few weeks. We would appreciate your feedback and comments so that we may continue to improve ourselves and the health of our patients.

## 2024-03-12 NOTE — Progress Notes (Signed)
 I agree with the above plan

## 2024-04-11 DIAGNOSIS — E7800 Pure hypercholesterolemia, unspecified: Secondary | ICD-10-CM | POA: Diagnosis not present

## 2024-05-22 DIAGNOSIS — Z1231 Encounter for screening mammogram for malignant neoplasm of breast: Secondary | ICD-10-CM | POA: Diagnosis not present

## 2024-05-22 DIAGNOSIS — M810 Age-related osteoporosis without current pathological fracture: Secondary | ICD-10-CM | POA: Diagnosis not present

## 2024-06-28 DIAGNOSIS — C44719 Basal cell carcinoma of skin of left lower limb, including hip: Secondary | ICD-10-CM | POA: Diagnosis not present

## 2024-06-28 DIAGNOSIS — D225 Melanocytic nevi of trunk: Secondary | ICD-10-CM | POA: Diagnosis not present

## 2024-06-28 DIAGNOSIS — D0472 Carcinoma in situ of skin of left lower limb, including hip: Secondary | ICD-10-CM | POA: Diagnosis not present

## 2024-06-28 DIAGNOSIS — L821 Other seborrheic keratosis: Secondary | ICD-10-CM | POA: Diagnosis not present

## 2024-06-28 DIAGNOSIS — L570 Actinic keratosis: Secondary | ICD-10-CM | POA: Diagnosis not present

## 2024-06-28 DIAGNOSIS — Z85828 Personal history of other malignant neoplasm of skin: Secondary | ICD-10-CM | POA: Diagnosis not present

## 2024-06-28 DIAGNOSIS — Z85820 Personal history of malignant melanoma of skin: Secondary | ICD-10-CM | POA: Diagnosis not present

## 2024-06-28 DIAGNOSIS — L814 Other melanin hyperpigmentation: Secondary | ICD-10-CM | POA: Diagnosis not present
